# Patient Record
Sex: Male | Born: 1970 | Race: White | Hispanic: No | Marital: Single | Smoking: Never smoker
Health system: Southern US, Community
[De-identification: ages and names within clinical notes are randomized; demographics above are authoritative.]

## PROBLEM LIST (undated history)

## (undated) DIAGNOSIS — Z5189 Encounter for other specified aftercare: Secondary | ICD-10-CM

## (undated) DIAGNOSIS — I10 Essential (primary) hypertension: Secondary | ICD-10-CM

## (undated) DIAGNOSIS — K219 Gastro-esophageal reflux disease without esophagitis: Secondary | ICD-10-CM

## (undated) DIAGNOSIS — E7800 Pure hypercholesterolemia, unspecified: Secondary | ICD-10-CM

## (undated) DIAGNOSIS — F419 Anxiety disorder, unspecified: Secondary | ICD-10-CM

## (undated) HISTORY — DX: Encounter for other specified aftercare: Z51.89

## (undated) HISTORY — DX: Gastro-esophageal reflux disease without esophagitis: K21.9

## (undated) HISTORY — DX: Anxiety disorder, unspecified: F41.9

---

## 2013-06-25 ENCOUNTER — Emergency Department (HOSPITAL_COMMUNITY): Payer: No Typology Code available for payment source

## 2013-06-25 ENCOUNTER — Encounter (HOSPITAL_COMMUNITY): Payer: Self-pay | Admitting: Radiology

## 2013-06-25 ENCOUNTER — Inpatient Hospital Stay (HOSPITAL_COMMUNITY)
Admission: EM | Admit: 2013-06-25 | Discharge: 2013-06-28 | DRG: 472 | Disposition: A | Discharge status: Home Health | Payer: No Typology Code available for payment source | Attending: Surgery | Admitting: Surgery

## 2013-06-25 DIAGNOSIS — D62 Acute posthemorrhagic anemia: Secondary | ICD-10-CM | POA: Diagnosis not present

## 2013-06-25 DIAGNOSIS — F419 Anxiety disorder, unspecified: Secondary | ICD-10-CM | POA: Diagnosis present

## 2013-06-25 DIAGNOSIS — S22019A Unspecified fracture of first thoracic vertebra, initial encounter for closed fracture: Secondary | ICD-10-CM | POA: Diagnosis present

## 2013-06-25 DIAGNOSIS — E7800 Pure hypercholesterolemia, unspecified: Secondary | ICD-10-CM | POA: Diagnosis present

## 2013-06-25 DIAGNOSIS — F10929 Alcohol use, unspecified with intoxication, unspecified: Secondary | ICD-10-CM | POA: Diagnosis present

## 2013-06-25 DIAGNOSIS — S0100XA Unspecified open wound of scalp, initial encounter: Secondary | ICD-10-CM

## 2013-06-25 DIAGNOSIS — I10 Essential (primary) hypertension: Secondary | ICD-10-CM | POA: Diagnosis present

## 2013-06-25 DIAGNOSIS — S129XXA Fracture of neck, unspecified, initial encounter: Secondary | ICD-10-CM

## 2013-06-25 DIAGNOSIS — IMO0002 Reserved for concepts with insufficient information to code with codable children: Secondary | ICD-10-CM | POA: Diagnosis present

## 2013-06-25 DIAGNOSIS — S0101XA Laceration without foreign body of scalp, initial encounter: Secondary | ICD-10-CM | POA: Diagnosis present

## 2013-06-25 DIAGNOSIS — Y92411 Interstate highway as the place of occurrence of the external cause: Secondary | ICD-10-CM

## 2013-06-25 DIAGNOSIS — R339 Retention of urine, unspecified: Secondary | ICD-10-CM | POA: Diagnosis not present

## 2013-06-25 DIAGNOSIS — F411 Generalized anxiety disorder: Secondary | ICD-10-CM | POA: Diagnosis present

## 2013-06-25 DIAGNOSIS — F1010 Alcohol abuse, uncomplicated: Secondary | ICD-10-CM | POA: Diagnosis present

## 2013-06-25 DIAGNOSIS — E785 Hyperlipidemia, unspecified: Secondary | ICD-10-CM | POA: Diagnosis present

## 2013-06-25 DIAGNOSIS — R338 Other retention of urine: Secondary | ICD-10-CM | POA: Diagnosis not present

## 2013-06-25 DIAGNOSIS — S12600A Unspecified displaced fracture of seventh cervical vertebra, initial encounter for closed fracture: Secondary | ICD-10-CM | POA: Diagnosis present

## 2013-06-25 DIAGNOSIS — S22009A Unspecified fracture of unspecified thoracic vertebra, initial encounter for closed fracture: Secondary | ICD-10-CM | POA: Diagnosis present

## 2013-06-25 DIAGNOSIS — S32009A Unspecified fracture of unspecified lumbar vertebra, initial encounter for closed fracture: Secondary | ICD-10-CM

## 2013-06-25 HISTORY — DX: Essential (primary) hypertension: I10

## 2013-06-25 HISTORY — DX: Pure hypercholesterolemia, unspecified: E78.00

## 2013-06-25 LAB — CBC WITH DIFFERENTIAL/PLATELET
BASOS PCT: 1 % (ref 0–1)
Basophils Absolute: 0.1 10*3/uL (ref 0.0–0.1)
EOS ABS: 0 10*3/uL (ref 0.0–0.7)
Eosinophils Relative: 0 % (ref 0–5)
HCT: 41.4 % (ref 39.0–52.0)
HEMOGLOBIN: 14.9 g/dL (ref 13.0–17.0)
Lymphocytes Relative: 50 % — ABNORMAL HIGH (ref 12–46)
Lymphs Abs: 3 10*3/uL (ref 0.7–4.0)
MCH: 34.7 pg — ABNORMAL HIGH (ref 26.0–34.0)
MCHC: 36 g/dL (ref 30.0–36.0)
MCV: 96.3 fL (ref 78.0–100.0)
Monocytes Absolute: 0.5 10*3/uL (ref 0.1–1.0)
Monocytes Relative: 8 % (ref 3–12)
NEUTROS PCT: 41 % — AB (ref 43–77)
Neutro Abs: 2.4 10*3/uL (ref 1.7–7.7)
PLATELETS: 160 10*3/uL (ref 150–400)
RBC: 4.3 MIL/uL (ref 4.22–5.81)
RDW: 14.9 % (ref 11.5–15.5)
WBC: 6 10*3/uL (ref 4.0–10.5)

## 2013-06-25 LAB — ABO/RH: ABO/RH(D): A POS

## 2013-06-25 LAB — I-STAT CHEM 8, ED
BUN: 5 mg/dL — AB (ref 6–23)
CREATININE: 1.3 mg/dL (ref 0.50–1.35)
Calcium, Ion: 0.89 mmol/L — ABNORMAL LOW (ref 1.12–1.23)
Chloride: 101 mEq/L (ref 96–112)
Glucose, Bld: 124 mg/dL — ABNORMAL HIGH (ref 70–99)
HCT: 48 % (ref 39.0–52.0)
Hemoglobin: 16.3 g/dL (ref 13.0–17.0)
POTASSIUM: 6 meq/L — AB (ref 3.7–5.3)
SODIUM: 138 meq/L (ref 137–147)
TCO2: 24 mmol/L (ref 0–100)

## 2013-06-25 LAB — COMPREHENSIVE METABOLIC PANEL
ALBUMIN: 3.7 g/dL (ref 3.5–5.2)
ALT: 78 U/L — AB (ref 0–53)
AST: 194 U/L — AB (ref 0–37)
Alkaline Phosphatase: 105 U/L (ref 39–117)
BILIRUBIN TOTAL: 0.5 mg/dL (ref 0.3–1.2)
BUN: 6 mg/dL (ref 6–23)
CHLORIDE: 94 meq/L — AB (ref 96–112)
CO2: 19 mEq/L (ref 19–32)
Calcium: 8.5 mg/dL (ref 8.4–10.5)
Creatinine, Ser: 0.7 mg/dL (ref 0.50–1.35)
GFR calc Af Amer: >90 mL/min (ref >90)
GFR calc non Af Amer: >90 mL/min (ref >90)
Glucose, Bld: 117 mg/dL — ABNORMAL HIGH (ref 70–99)
POTASSIUM: 5 meq/L (ref 3.7–5.3)
SODIUM: 137 meq/L (ref 137–147)
TOTAL PROTEIN: 8.6 g/dL — AB (ref 6.0–8.3)

## 2013-06-25 LAB — ETHANOL: Alcohol, Ethyl (B): 405 mg/dL (ref 0–11)

## 2013-06-25 LAB — PROTIME-INR
INR: 0.98 (ref 0.00–1.49)
PROTHROMBIN TIME: 12.8 s (ref 11.6–15.2)

## 2013-06-25 LAB — TYPE AND SCREEN
ABO/RH(D): A POS
Antibody Screen: NEGATIVE

## 2013-06-25 LAB — I-STAT CG4 LACTIC ACID, ED: LACTIC ACID, VENOUS: 5.34 mmol/L — AB (ref 0.5–2.2)

## 2013-06-25 LAB — CDS SEROLOGY

## 2013-06-25 MED ORDER — ONDANSETRON HCL 4 MG/2ML IJ SOLN
INTRAMUSCULAR | Status: AC
  Administered 2013-06-26: 4 mg
  Filled 2013-06-25: qty 2

## 2013-06-25 MED ORDER — SODIUM CHLORIDE 0.9 % IV SOLN
INTRAVENOUS | Status: AC | PRN
  Administered 2013-06-25: 125 mL/h via INTRAVENOUS

## 2013-06-25 MED ORDER — IOHEXOL 300 MG/ML  SOLN
100.0000 mL | Freq: Once | INTRAMUSCULAR | Status: AC | PRN
  Administered 2013-06-25: 100 mL via INTRAVENOUS
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
## 2013-06-25 NOTE — ED Notes (Addendum)
All belongings (jewelry and clothing, shoes) given to financee

## 2013-06-25 NOTE — ED Notes (Signed)
Patient transported to MRI 

## 2013-06-25 NOTE — ED Notes (Addendum)
Pt transported to CT ?

## 2013-06-25 NOTE — ED Notes (Signed)
NOTIFIED DR. BEATON IN PERSON OF PATIENTS LAB RESULTS OF CHEM 8 + ,CG4+ LACTIC ACID, 19:10 ,06/25/2013

## 2013-06-25 NOTE — ED Notes (Addendum)
Head laceration sutured with 29 staples, bleeding controlled and  Dressing applied.  Face and hands cleaned

## 2013-06-25 NOTE — ED Notes (Signed)
Dr. Dutch QuintPoole in to see patient and explained to him regarding the need for surgery sometime tomorrow.  Old collar removed and Aspen collar applied by Dr. Dutch QuintPoole.

## 2013-06-25 NOTE — ED Notes (Signed)
Patient back from MRI.
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
## 2013-06-26 ENCOUNTER — Encounter (HOSPITAL_COMMUNITY): Admission: EM | Disposition: A | Discharge status: Home Health | Payer: Self-pay | Source: Home / Self Care

## 2013-06-26 ENCOUNTER — Encounter (HOSPITAL_COMMUNITY): Payer: No Typology Code available for payment source | Admitting: Anesthesiology

## 2013-06-26 ENCOUNTER — Inpatient Hospital Stay (HOSPITAL_COMMUNITY): Payer: No Typology Code available for payment source

## 2013-06-26 ENCOUNTER — Inpatient Hospital Stay (HOSPITAL_COMMUNITY): Payer: No Typology Code available for payment source | Admitting: Anesthesiology

## 2013-06-26 DIAGNOSIS — I10 Essential (primary) hypertension: Secondary | ICD-10-CM | POA: Insufficient documentation

## 2013-06-26 DIAGNOSIS — D62 Acute posthemorrhagic anemia: Secondary | ICD-10-CM

## 2013-06-26 DIAGNOSIS — E7800 Pure hypercholesterolemia, unspecified: Secondary | ICD-10-CM | POA: Insufficient documentation

## 2013-06-26 DIAGNOSIS — F1010 Alcohol abuse, uncomplicated: Secondary | ICD-10-CM | POA: Diagnosis present

## 2013-06-26 DIAGNOSIS — S0101XA Laceration without foreign body of scalp, initial encounter: Secondary | ICD-10-CM | POA: Diagnosis present

## 2013-06-26 DIAGNOSIS — F10929 Alcohol use, unspecified with intoxication, unspecified: Secondary | ICD-10-CM | POA: Diagnosis present

## 2013-06-26 DIAGNOSIS — S22019A Unspecified fracture of first thoracic vertebra, initial encounter for closed fracture: Secondary | ICD-10-CM | POA: Diagnosis present

## 2013-06-26 HISTORY — PX: ANTERIOR CERVICAL DECOMP/DISCECTOMY FUSION: SHX1161

## 2013-06-26 LAB — MRSA PCR SCREENING: MRSA by PCR: NEGATIVE

## 2013-06-26 LAB — BASIC METABOLIC PANEL
BUN: 6 mg/dL (ref 6–23)
CALCIUM: 7.9 mg/dL — AB (ref 8.4–10.5)
CO2: 19 mEq/L (ref 19–32)
Chloride: 99 mEq/L (ref 96–112)
Creatinine, Ser: 0.67 mg/dL (ref 0.50–1.35)
GFR calc non Af Amer: >90 mL/min (ref >90)
Glucose, Bld: 115 mg/dL — ABNORMAL HIGH (ref 70–99)
Potassium: 4.5 mEq/L (ref 3.7–5.3)
Sodium: 140 mEq/L (ref 137–147)

## 2013-06-26 LAB — CBC
HEMATOCRIT: 34.9 % — AB (ref 39.0–52.0)
Hemoglobin: 12.1 g/dL — ABNORMAL LOW (ref 13.0–17.0)
MCH: 33.7 pg (ref 26.0–34.0)
MCHC: 34.7 g/dL (ref 30.0–36.0)
MCV: 97.2 fL (ref 78.0–100.0)
Platelets: 132 10*3/uL — ABNORMAL LOW (ref 150–400)
RBC: 3.59 MIL/uL — ABNORMAL LOW (ref 4.22–5.81)
RDW: 14.6 % (ref 11.5–15.5)
WBC: 6.3 10*3/uL (ref 4.0–10.5)

## 2013-06-26 SURGERY — ANTERIOR CERVICAL DECOMPRESSION/DISCECTOMY FUSION 1 LEVEL/HARDWARE REMOVAL
Anesthesia: General | Site: Neck

## 2013-06-26 MED ORDER — THIAMINE HCL 100 MG/ML IJ SOLN
100.0000 mg | Freq: Every day | INTRAMUSCULAR | Status: DC
  Filled 2013-06-26 (×3): qty 1

## 2013-06-26 MED ORDER — ONDANSETRON HCL 4 MG PO TABS: 4.0000 mg | ORAL_TABLET | Freq: Four times a day (QID) | ORAL | Status: DC | PRN

## 2013-06-26 MED ORDER — THROMBIN 5000 UNITS EX SOLR
CUTANEOUS | Status: DC | PRN
Start: 2013-06-26 — End: 2013-06-26
  Administered 2013-06-26 (×2): 5000 [IU] via TOPICAL

## 2013-06-26 MED ORDER — MORPHINE SULFATE 2 MG/ML IJ SOLN
2.0000 mg | INTRAMUSCULAR | Status: DC | PRN
  Administered 2013-06-26: 4 mg via INTRAVENOUS
  Administered 2013-06-26: 2 mg via INTRAVENOUS
  Administered 2013-06-26: 4 mg via INTRAVENOUS
  Filled 2013-06-26: qty 1
  Filled 2013-06-26 (×2): qty 2

## 2013-06-26 MED ORDER — LORAZEPAM 2 MG/ML IJ SOLN
1.0000 mg | Freq: Four times a day (QID) | INTRAMUSCULAR | Status: DC | PRN
Start: 2013-06-26 — End: 2013-06-26

## 2013-06-26 MED ORDER — LORAZEPAM 2 MG/ML IJ SOLN: 0.0000 mg | Freq: Two times a day (BID) | INTRAMUSCULAR | Status: DC

## 2013-06-26 MED ORDER — MIDAZOLAM HCL 5 MG/5ML IJ SOLN
INTRAMUSCULAR | Status: DC | PRN
  Administered 2013-06-26: 2 mg via INTRAVENOUS

## 2013-06-26 MED ORDER — ONDANSETRON HCL 4 MG/2ML IJ SOLN: 4.0000 mg | INTRAMUSCULAR | Status: DC | PRN

## 2013-06-26 MED ORDER — FENTANYL CITRATE 0.05 MG/ML IJ SOLN
INTRAMUSCULAR | Status: AC
  Filled 2013-06-26: qty 5

## 2013-06-26 MED ORDER — NEOSTIGMINE METHYLSULFATE 1 MG/ML IJ SOLN
INTRAMUSCULAR | Status: DC | PRN
  Administered 2013-06-26: 5 mg via INTRAVENOUS

## 2013-06-26 MED ORDER — SODIUM CHLORIDE 0.9 % IJ SOLN
3.0000 mL | Freq: Two times a day (BID) | INTRAMUSCULAR | Status: DC
  Administered 2013-06-26 – 2013-06-28 (×4): 3 mL via INTRAVENOUS

## 2013-06-26 MED ORDER — LIDOCAINE HCL (CARDIAC) 20 MG/ML IV SOLN
INTRAVENOUS | Status: AC
  Filled 2013-06-26: qty 5

## 2013-06-26 MED ORDER — PHENYLEPHRINE HCL 10 MG/ML IJ SOLN
INTRAMUSCULAR | Status: DC | PRN
  Administered 2013-06-26: 160 ug via INTRAVENOUS

## 2013-06-26 MED ORDER — FOLIC ACID 1 MG PO TABS
1.0000 mg | ORAL_TABLET | Freq: Every day | ORAL | Status: DC
  Administered 2013-06-27 – 2013-06-28 (×2): 1 mg via ORAL
  Filled 2013-06-26 (×3): qty 1

## 2013-06-26 MED ORDER — VITAMIN B-1 100 MG PO TABS
100.0000 mg | ORAL_TABLET | Freq: Every day | ORAL | Status: DC
  Administered 2013-06-27 – 2013-06-28 (×2): 100 mg via ORAL
  Filled 2013-06-26 (×3): qty 1

## 2013-06-26 MED ORDER — PANTOPRAZOLE SODIUM 40 MG IV SOLR
40.0000 mg | Freq: Every day | INTRAVENOUS | Status: DC
  Filled 2013-06-26 (×2): qty 40

## 2013-06-26 MED ORDER — OXYCODONE-ACETAMINOPHEN 5-325 MG PO TABS: 1.0000 | ORAL_TABLET | ORAL | Status: DC | PRN

## 2013-06-26 MED ORDER — CEFAZOLIN SODIUM-DEXTROSE 2-3 GM-% IV SOLR
2.0000 g | INTRAVENOUS | Status: AC
  Administered 2013-06-26: 2 g via INTRAVENOUS
  Filled 2013-06-26: qty 50

## 2013-06-26 MED ORDER — ONDANSETRON HCL 4 MG/2ML IJ SOLN
INTRAMUSCULAR | Status: DC | PRN
  Administered 2013-06-26: 4 mg via INTRAVENOUS

## 2013-06-26 MED ORDER — MENTHOL 3 MG MT LOZG
1.0000 | LOZENGE | OROMUCOSAL | Status: DC | PRN
  Filled 2013-06-26: qty 9

## 2013-06-26 MED ORDER — GLYCOPYRROLATE 0.2 MG/ML IJ SOLN
INTRAMUSCULAR | Status: AC
  Filled 2013-06-26: qty 3

## 2013-06-26 MED ORDER — SODIUM CHLORIDE 0.9 % IR SOLN
Status: DC | PRN
  Administered 2013-06-26: 19:00:00

## 2013-06-26 MED ORDER — FENTANYL CITRATE 0.05 MG/ML IJ SOLN
INTRAMUSCULAR | Status: DC | PRN
Start: 2013-06-26 — End: 2013-06-26
  Administered 2013-06-26: 100 ug via INTRAVENOUS
  Administered 2013-06-26: 150 ug via INTRAVENOUS
  Administered 2013-06-26 (×2): 100 ug via INTRAVENOUS
  Administered 2013-06-26: 50 ug via INTRAVENOUS

## 2013-06-26 MED ORDER — FLUMAZENIL 0.5 MG/5ML IV SOLN
0.5000 mg | Freq: Once | INTRAVENOUS | Status: AC
  Administered 2013-06-26: 0.5 mg via INTRAVENOUS

## 2013-06-26 MED ORDER — NEOSTIGMINE METHYLSULFATE 1 MG/ML IJ SOLN
INTRAMUSCULAR | Status: AC
Start: 2013-06-26 — End: 2013-06-26
  Filled 2013-06-26: qty 10

## 2013-06-26 MED ORDER — SENNA 8.6 MG PO TABS
1.0000 | ORAL_TABLET | Freq: Two times a day (BID) | ORAL | Status: DC
  Administered 2013-06-26 – 2013-06-28 (×4): 8.6 mg via ORAL
  Filled 2013-06-26 (×6): qty 1

## 2013-06-26 MED ORDER — ROCURONIUM BROMIDE 50 MG/5ML IV SOLN
INTRAVENOUS | Status: AC
  Filled 2013-06-26: qty 1

## 2013-06-26 MED ORDER — PROPOFOL 10 MG/ML IV BOLUS
INTRAVENOUS | Status: AC
Start: 2013-06-26 — End: 2013-06-26
  Filled 2013-06-26: qty 20

## 2013-06-26 MED ORDER — ADULT MULTIVITAMIN W/MINERALS CH
1.0000 | ORAL_TABLET | Freq: Every day | ORAL | Status: DC
  Administered 2013-06-27 – 2013-06-28 (×2): 1 via ORAL
  Filled 2013-06-26 (×3): qty 1

## 2013-06-26 MED ORDER — LIDOCAINE HCL (CARDIAC) 20 MG/ML IV SOLN
INTRAVENOUS | Status: DC | PRN
  Administered 2013-06-26: 60 mg via INTRAVENOUS

## 2013-06-26 MED ORDER — SODIUM CHLORIDE 0.9 % IV SOLN
250.0000 mL | INTRAVENOUS | Status: DC
  Administered 2013-06-26: 23:00:00 via INTRAVENOUS

## 2013-06-26 MED ORDER — SODIUM CHLORIDE 0.9 % IJ SOLN: 3.0000 mL | INTRAMUSCULAR | Status: DC | PRN

## 2013-06-26 MED ORDER — HEMOSTATIC AGENTS (NO CHARGE) OPTIME
TOPICAL | Status: DC | PRN
  Administered 2013-06-26: 1 via TOPICAL

## 2013-06-26 MED ORDER — FLUMAZENIL 0.5 MG/5ML IV SOLN
INTRAVENOUS | Status: AC
  Administered 2013-06-26: 0.5 mg via INTRAVENOUS
  Filled 2013-06-26: qty 5

## 2013-06-26 MED ORDER — ACETAMINOPHEN 650 MG RE SUPP: 650.0000 mg | RECTAL | Status: DC | PRN

## 2013-06-26 MED ORDER — CYCLOBENZAPRINE HCL 10 MG PO TABS: 10.0000 mg | ORAL_TABLET | Freq: Three times a day (TID) | ORAL | Status: DC | PRN

## 2013-06-26 MED ORDER — LORAZEPAM 2 MG/ML IJ SOLN
2.0000 mg | INTRAMUSCULAR | Status: DC | PRN
  Administered 2013-06-26: 2 mg via INTRAVENOUS
  Filled 2013-06-26: qty 1

## 2013-06-26 MED ORDER — HYDROCODONE-ACETAMINOPHEN 5-325 MG PO TABS: 1.0000 | ORAL_TABLET | ORAL | Status: DC | PRN

## 2013-06-26 MED ORDER — ONDANSETRON HCL 4 MG/2ML IJ SOLN
INTRAMUSCULAR | Status: AC
  Filled 2013-06-26: qty 2

## 2013-06-26 MED ORDER — ACETAMINOPHEN 325 MG PO TABS
650.0000 mg | ORAL_TABLET | ORAL | Status: DC | PRN
  Administered 2013-06-27: 650 mg via ORAL
  Filled 2013-06-26: qty 2

## 2013-06-26 MED ORDER — PHENOL 1.4 % MT LIQD: 1.0000 | OROMUCOSAL | Status: DC | PRN

## 2013-06-26 MED ORDER — GLYCOPYRROLATE 0.2 MG/ML IJ SOLN
INTRAMUSCULAR | Status: DC | PRN
  Administered 2013-06-26: 0.6 mg via INTRAVENOUS

## 2013-06-26 MED ORDER — LACTATED RINGERS IV SOLN
INTRAVENOUS | Status: DC | PRN
  Administered 2013-06-26 (×2): via INTRAVENOUS

## 2013-06-26 MED ORDER — ROCURONIUM BROMIDE 100 MG/10ML IV SOLN
INTRAVENOUS | Status: DC | PRN
  Administered 2013-06-26: 50 mg via INTRAVENOUS
  Administered 2013-06-26: 10 mg via INTRAVENOUS

## 2013-06-26 MED ORDER — ALUM & MAG HYDROXIDE-SIMETH 200-200-20 MG/5ML PO SUSP: 30.0000 mL | Freq: Four times a day (QID) | ORAL | Status: DC | PRN

## 2013-06-26 MED ORDER — PANTOPRAZOLE SODIUM 40 MG PO TBEC: 40.0000 mg | DELAYED_RELEASE_TABLET | Freq: Every day | ORAL | Status: DC

## 2013-06-26 MED ORDER — HYDROMORPHONE HCL PF 1 MG/ML IJ SOLN
0.5000 mg | INTRAMUSCULAR | Status: DC | PRN
Start: 2013-06-26 — End: 2013-06-28
  Administered 2013-06-27 (×3): 1 mg via INTRAVENOUS
  Filled 2013-06-26 (×3): qty 1

## 2013-06-26 MED ORDER — 0.9 % SODIUM CHLORIDE (POUR BTL) OPTIME
TOPICAL | Status: DC | PRN
  Administered 2013-06-26: 1000 mL

## 2013-06-26 MED ORDER — LORAZEPAM 2 MG/ML IJ SOLN
0.0000 mg | Freq: Four times a day (QID) | INTRAMUSCULAR | Status: DC
  Administered 2013-06-26: 4 mg via INTRAVENOUS
  Administered 2013-06-26: 2 mg via INTRAVENOUS
  Filled 2013-06-26: qty 1
  Filled 2013-06-26: qty 2

## 2013-06-26 MED ORDER — ONDANSETRON HCL 4 MG/2ML IJ SOLN: 4.0000 mg | Freq: Four times a day (QID) | INTRAMUSCULAR | Status: DC | PRN

## 2013-06-26 MED ORDER — LORAZEPAM 1 MG PO TABS: 1.0000 mg | ORAL_TABLET | Freq: Four times a day (QID) | ORAL | Status: DC | PRN

## 2013-06-26 MED ORDER — MIDAZOLAM HCL 2 MG/2ML IJ SOLN
INTRAMUSCULAR | Status: AC
  Filled 2013-06-26: qty 2

## 2013-06-26 MED ORDER — POTASSIUM CHLORIDE IN NACL 20-0.9 MEQ/L-% IV SOLN
INTRAVENOUS | Status: DC
  Administered 2013-06-26: 02:00:00 via INTRAVENOUS
  Filled 2013-06-26 (×4): qty 1000

## 2013-06-26 MED ORDER — CEFAZOLIN SODIUM 1-5 GM-% IV SOLN
1.0000 g | Freq: Three times a day (TID) | INTRAVENOUS | Status: AC
  Administered 2013-06-26 – 2013-06-27 (×2): 1 g via INTRAVENOUS
  Filled 2013-06-26 (×2): qty 50

## 2013-06-26 MED ORDER — PROPOFOL 10 MG/ML IV BOLUS
INTRAVENOUS | Status: DC | PRN
  Administered 2013-06-26: 300 mg via INTRAVENOUS

## 2013-06-26 SURGICAL SUPPLY — 68 items
BAG DECANTER FOR FLEXI CONT (MISCELLANEOUS) ×3 IMPLANT
BENZOIN TINCTURE PRP APPL 2/3 (GAUZE/BANDAGES/DRESSINGS) ×3 IMPLANT
BIT DRILL NEURO 2X3.1 SFT TUCH (MISCELLANEOUS) IMPLANT
BRUSH SCRUB EZ PLAIN DRY (MISCELLANEOUS) ×3 IMPLANT
BUR MATCHSTICK NEURO 3.0 LAGG (BURR) ×3 IMPLANT
CANISTER SUCT 3000ML (MISCELLANEOUS) ×3 IMPLANT
CLOSURE WOUND 1/2 X4 (GAUZE/BANDAGES/DRESSINGS) ×1
CONT SPEC 4OZ CLIKSEAL STRL BL (MISCELLANEOUS) ×3 IMPLANT
DRAIN SNY WOU 7FLT (WOUND CARE) ×3 IMPLANT
DRAPE C-ARM 42X72 X-RAY (DRAPES) ×6 IMPLANT
DRAPE LAPAROTOMY 100X72 PEDS (DRAPES) ×3 IMPLANT
DRAPE MICROSCOPE LEICA (MISCELLANEOUS) ×3 IMPLANT
DRAPE MICROSCOPE ZEISS OPMI (DRAPES) IMPLANT
DRAPE POUCH INSTRU U-SHP 10X18 (DRAPES) ×3 IMPLANT
DRILL BIT (BIT) ×3 IMPLANT
DRILL NEURO 2X3.1 SOFT TOUCH (MISCELLANEOUS)
DRSG OPSITE POSTOP 4X8 (GAUZE/BANDAGES/DRESSINGS) ×3 IMPLANT
DURAPREP 6ML APPLICATOR 50/CS (WOUND CARE) ×3 IMPLANT
ELECT COATED BLADE 2.86 ST (ELECTRODE) ×3 IMPLANT
ELECT REM PT RETURN 9FT ADLT (ELECTROSURGICAL) ×3
ELECTRODE REM PT RTRN 9FT ADLT (ELECTROSURGICAL) ×1 IMPLANT
GAUZE SPONGE 4X4 16PLY XRAY LF (GAUZE/BANDAGES/DRESSINGS) IMPLANT
GLOVE BIO SURGEON STRL SZ 6.5 (GLOVE) ×4 IMPLANT
GLOVE BIO SURGEONS STRL SZ 6.5 (GLOVE) ×2
GLOVE BIOGEL PI IND STRL 6.5 (GLOVE) ×1 IMPLANT
GLOVE BIOGEL PI IND STRL 7.5 (GLOVE) ×1 IMPLANT
GLOVE BIOGEL PI INDICATOR 6.5 (GLOVE) ×2
GLOVE BIOGEL PI INDICATOR 7.5 (GLOVE) ×2
GLOVE ECLIPSE 6.5 STRL STRAW (GLOVE) ×3 IMPLANT
GLOVE ECLIPSE 7.5 STRL STRAW (GLOVE) ×3 IMPLANT
GLOVE ECLIPSE 8.5 STRL (GLOVE) ×3 IMPLANT
GLOVE EXAM NITRILE LRG STRL (GLOVE) ×3 IMPLANT
GLOVE EXAM NITRILE MD LF STRL (GLOVE) IMPLANT
GLOVE EXAM NITRILE XL STR (GLOVE) IMPLANT
GLOVE EXAM NITRILE XS STR PU (GLOVE) IMPLANT
GOWN BRE IMP SLV AUR LG STRL (GOWN DISPOSABLE) IMPLANT
GOWN BRE IMP SLV AUR XL STRL (GOWN DISPOSABLE) IMPLANT
GOWN STRL REIN 2XL LVL4 (GOWN DISPOSABLE) IMPLANT
GOWN STRL REUS W/ TWL LRG LVL3 (GOWN DISPOSABLE) ×2 IMPLANT
GOWN STRL REUS W/ TWL XL LVL3 (GOWN DISPOSABLE) ×2 IMPLANT
GOWN STRL REUS W/TWL LRG LVL3 (GOWN DISPOSABLE) ×4
GOWN STRL REUS W/TWL XL LVL3 (GOWN DISPOSABLE) ×4
HALTER HD/CHIN CERV TRACTION D (MISCELLANEOUS) ×3 IMPLANT
KIT BASIN OR (CUSTOM PROCEDURE TRAY) ×3 IMPLANT
KIT ROOM TURNOVER OR (KITS) ×3 IMPLANT
NEEDLE SPNL 20GX3.5 QUINCKE YW (NEEDLE) ×3 IMPLANT
NS IRRIG 1000ML POUR BTL (IV SOLUTION) ×3 IMPLANT
PACK LAMINECTOMY NEURO (CUSTOM PROCEDURE TRAY) ×3 IMPLANT
PAD ARMBOARD 7.5X6 YLW CONV (MISCELLANEOUS) ×9 IMPLANT
PLATE VISION ELITE 27.5MM (Plate) ×3 IMPLANT
RUBBERBAND STERILE (MISCELLANEOUS) ×6 IMPLANT
SCREW ST 15X4XST FXANG NS (Screw) ×4 IMPLANT
SCREW ST FIX 4 ATL (Screw) ×8 IMPLANT
SPACER BONE CORNERSTONE 8X14 (Orthopedic Implant) ×3 IMPLANT
SPONGE GAUZE 4X4 12PLY (GAUZE/BANDAGES/DRESSINGS) ×3 IMPLANT
SPONGE INTESTINAL PEANUT (DISPOSABLE) ×3 IMPLANT
SPONGE SURGIFOAM ABS GEL SZ50 (HEMOSTASIS) ×3 IMPLANT
STRIP CLOSURE SKIN 1/2X4 (GAUZE/BANDAGES/DRESSINGS) ×2 IMPLANT
SUT PDS AB 5-0 P3 18 (SUTURE) ×3 IMPLANT
SUT VIC AB 3-0 SH 8-18 (SUTURE) ×3 IMPLANT
SYR 20ML ECCENTRIC (SYRINGE) ×3 IMPLANT
SYR BULB IRRIGATION 50ML (SYRINGE) ×3 IMPLANT
TAPE CLOTH 4X10 WHT NS (GAUZE/BANDAGES/DRESSINGS) ×3 IMPLANT
TAPE STRIPS DRAPE STRL (GAUZE/BANDAGES/DRESSINGS) ×3 IMPLANT
TOWEL OR 17X24 6PK STRL BLUE (TOWEL DISPOSABLE) ×3 IMPLANT
TOWEL OR 17X26 10 PK STRL BLUE (TOWEL DISPOSABLE) ×3 IMPLANT
TRAP SPECIMEN MUCOUS 40CC (MISCELLANEOUS) ×3 IMPLANT
WATER STERILE IRR 1000ML POUR (IV SOLUTION) ×3 IMPLANT

## 2013-06-26 NOTE — Anesthesia Preprocedure Evaluation (Signed)
Anesthesia Evaluation  Patient identified by MRN, date of birth, ID band Patient awake    Reviewed: Allergy & Precautions, H&P , NPO status , Patient's Chart, lab work & pertinent test results  History of Anesthesia Complications Negative for: history of anesthetic complications  Airway Mallampati: III TM Distance: >3 FB Neck ROM: Limited    Dental  (+) Dental Advisory Given   Pulmonary neg pulmonary ROS,  breath sounds clear to auscultation  Pulmonary exam normal       Cardiovascular hypertension (no longer can afford HTN meds), - anginaRhythm:Regular Rate:Normal     Neuro/Psych  C-spine not cleared    GI/Hepatic negative GI ROS, (+)     substance abuse  alcohol use, Elevated LFTs   Endo/Other  negative endocrine ROS  Renal/GU negative Renal ROS     Musculoskeletal   Abdominal   Peds  Hematology   Anesthesia Other Findings   Reproductive/Obstetrics                           Anesthesia Physical Anesthesia Plan  ASA: III  Anesthesia Plan: General   Post-op Pain Management:    Induction: Intravenous  Airway Management Planned: Oral ETT and Video Laryngoscope Planned  Additional Equipment:   Intra-op Plan:   Post-operative Plan: Extubation in OR  Informed Consent: I have reviewed the patients History and Physical, chart, labs and discussed the procedure including the risks, benefits and alternatives for the proposed anesthesia with the patient or authorized representative who has indicated his/her understanding and acceptance.   Dental advisory given  Plan Discussed with: Surgeon and CRNA  Anesthesia Plan Comments: (Plan routine monitors, GETA with c-collar on and VideoGlide intubation )        Anesthesia Quick Evaluation
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
## 2013-06-26 NOTE — Progress Notes (Signed)
Patient to have surgery today.  CIWA protocol.  This patient has been seen and I agree with the findings and treatment plan.  Marta LamasJames O. Gae BonWyatt, III, MD, FACS [phone] (pager) [phone] (direct pager) Trauma Surgeon

## 2013-06-26 NOTE — Anesthesia Postprocedure Evaluation (Signed)
  Anesthesia Post-op Note  Patient: Tim CockayneChris Balaban  Procedure(s) Performed: Procedure(s) with comments: ANTERIOR CERVICAL DECOMPRESSION/DISCECTOMY FUSION 1 LEVEL/HARDWARE REMOVAL (N/A) - Cervical seven - Thoracic one Anterior Cervical Decompression Fusion  Patient Location: PACU  Anesthesia Type:General  Level of Consciousness: awake, oriented, sedated and patient cooperative  Airway and Oxygen Therapy: Patient Spontanous Breathing  Post-op Pain: mild  Post-op Assessment: Post-op Vital signs reviewed, Patient's Cardiovascular Status Stable, Respiratory Function Stable, Patent Airway, No signs of Nausea or vomiting and Pain level controlled  Post-op Vital Signs: stable  Complications: No apparent anesthesia complications

## 2013-06-26 NOTE — Transfer of Care (Signed)
Immediate Anesthesia Transfer of Care Note  Patient: Tim CockayneChris Tapscott  Procedure(s) Performed: Procedure(s) with comments: ANTERIOR CERVICAL DECOMPRESSION/DISCECTOMY FUSION 1 LEVEL/HARDWARE REMOVAL (N/A) - Cervical seven - Thoracic one Anterior Cervical Decompression Fusion  Patient Location: PACU  Anesthesia Type:General  Level of Consciousness: sedated  Airway & Oxygen Therapy: Patient Spontanous Breathing and Patient connected to face mask oxygen  Post-op Assessment: Report given to PACU RN and Post -op Vital signs reviewed and stable  Post vital signs: Reviewed and stable  Complications: No apparent anesthesia complications
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
## 2013-06-26 NOTE — Anesthesia Procedure Notes (Signed)
Procedure Name: Intubation Date/Time: 06/26/2013 6:26 PM Performed by: Coralee RudFLORES, Myrtha Tonkovich Pre-anesthesia Checklist: Patient identified, Emergency Drugs available, Suction available and Patient being monitored Patient Re-evaluated:Patient Re-evaluated prior to inductionOxygen Delivery Method: Circle system utilized Preoxygenation: Pre-oxygenation with 100% oxygen Intubation Type: IV induction Ventilation: Mask ventilation without difficulty Laryngoscope Size: Miller and 3 Grade View: Grade II Tube type: Oral Tube size: 7.5 mm Number of attempts: 2 (Patient in Aspen collar with stabilization by Dr. Burman RiisSmitm) Airway Equipment and Method: Stylet Placement Confirmation: ETT inserted through vocal cords under direct vision,  positive ETCO2 and breath sounds checked- equal and bilateral Secured at: 1 cm Tube secured with: Tape Dental Injury: Teeth and Oropharynx as per pre-operative assessment

## 2013-06-26 NOTE — Progress Notes (Signed)
Patient stable overnight. Still with neck pain still with headache. No radiating upper extremity pain. Denies weakness or sensory loss. Denies lower extremity symptoms.  Afebrile vital stable. Motor examination intact except for some slight intrinsic weakness in his right hand. Sensory examination nonfocal. Reflexes normal.  MRI scan done last night demonstrates severe dorsal ligamentous injury over several levels but particularly at C7 with a avulsion of the spinous process of C7 and central lamina of C7. Bilateral perched facets which showed fractures of the superior facets of T1 bilaterally. Traumatic disc herniation more toward the right side at C7-T1. Neural foraminal stenosis on the left side at C7-T1. No evidence of other disc herniation. No evidence of spinal cord signal abnormality. Small amount of dorsal epidural blood inches noncompressive.  Fracture subluxation C7 on T1 with some mild evidence of radiculopathy but no evidence of spinal cord injury. I discussed situation with the patient. I am hopeful that we can achieve decompression and stabilization by means of a C7/T1 anterior cervical discectomy and interbody fusion with interbody peek cage, local autograft, and anterior plate instrumentation. I've discussed the risks and benefits involved with surgery including but not limited to the risk of anesthesia, bleeding, infection, CSF leak, nerve root injury, spinal cord injury, fusion failure, instrumentation failure, continued pain, and nondistended. Patient is also aware that he may potentially need posterior cervical fusion as well. The patient agrees to proceed.

## 2013-06-26 NOTE — Brief Op Note (Signed)
06/25/2013 - 06/26/2013  8:03 PM  PATIENT:  Tim Hunter  43 y.o. male  PRE-OPERATIVE DIAGNOSIS:  C7/T1 fracture dislocation  POST-OPERATIVE DIAGNOSIS:  C7/T1 fracture dislocation  PROCEDURE:  Procedure(s) with comments: ANTERIOR CERVICAL DECOMPRESSION/DISCECTOMY FUSION 1 LEVEL/HARDWARE REMOVAL (N/A) - Cervical seven - Thoracic one Anterior Cervical Decompression Fusion  SURGEON:  Surgeon(s) and Role:    * Tim PaciniHenry A Jozee Hammer, MD - Primary    * Tim HurtKyle L Cabbell, MD - Assisting  PHYSICIAN ASSISTANT:   ASSISTANTS:    ANESTHESIA:   general  EBL:  Total I/O In: 1500 [I.V.:1500] Out: 200 [Blood:200]  BLOOD ADMINISTERED:none  DRAINS: (7 mm) Jackson-Pratt drain(s) with closed bulb suction in the Prevertebral space   LOCAL MEDICATIONS USED:  NONE  SPECIMEN:  No Specimen  DISPOSITION OF SPECIMEN:  N/A  COUNTS:  YES  TOURNIQUET:  * No tourniquets in log *  DICTATION: .Dragon Dictation  PLAN OF CARE: Admit to inpatient   PATIENT DISPOSITION:  PACU - hemodynamically stable.   Delay start of Pharmacological VTE agent (>24hrs) due to surgical blood loss or risk of bleeding: yes

## 2013-06-27 ENCOUNTER — Encounter (HOSPITAL_COMMUNITY): Payer: Self-pay | Admitting: Neurosurgery

## 2013-06-27 DIAGNOSIS — R338 Other retention of urine: Secondary | ICD-10-CM | POA: Diagnosis not present

## 2013-06-27 DIAGNOSIS — F419 Anxiety disorder, unspecified: Secondary | ICD-10-CM | POA: Diagnosis present

## 2013-06-27 LAB — URINALYSIS, ROUTINE W REFLEX MICROSCOPIC
Glucose, UA: NEGATIVE mg/dL
HGB URINE DIPSTICK: NEGATIVE
KETONES UR: 15 mg/dL — AB
Leukocytes, UA: NEGATIVE
NITRITE: NEGATIVE
PROTEIN: NEGATIVE mg/dL
Specific Gravity, Urine: 1.021 (ref 1.005–1.030)
UROBILINOGEN UA: 2 mg/dL — AB (ref 0.0–1.0)
pH: 7 (ref 5.0–8.0)

## 2013-06-27 MED ORDER — SPIRITUS FRUMENTI
2.0000 | Freq: Three times a day (TID) | ORAL | Status: DC
  Administered 2013-06-27: 2 via ORAL
  Administered 2013-06-27: 1 via ORAL
  Administered 2013-06-28 (×2): 2 via ORAL
  Filled 2013-06-27 (×6): qty 2

## 2013-06-27 MED ORDER — LORAZEPAM 2 MG/ML IJ SOLN: 1.0000 mg | Freq: Four times a day (QID) | INTRAMUSCULAR | Status: DC | PRN

## 2013-06-27 MED ORDER — MORPHINE SULFATE 2 MG/ML IJ SOLN: 2.0000 mg | INTRAMUSCULAR | Status: DC | PRN

## 2013-06-27 MED ORDER — OXYCODONE HCL 5 MG PO TABS: 5.0000 mg | ORAL_TABLET | ORAL | Status: DC | PRN

## 2013-06-27 MED ORDER — NAPROXEN 500 MG PO TABS
500.0000 mg | ORAL_TABLET | Freq: Two times a day (BID) | ORAL | Status: DC
  Administered 2013-06-27 – 2013-06-28 (×3): 500 mg via ORAL
  Filled 2013-06-27 (×6): qty 1

## 2013-06-27 MED ORDER — BACITRACIN ZINC 500 UNIT/GM EX OINT
TOPICAL_OINTMENT | Freq: Two times a day (BID) | CUTANEOUS | Status: DC
  Administered 2013-06-27 – 2013-06-28 (×2): via TOPICAL
  Filled 2013-06-27: qty 28.35

## 2013-06-27 MED ORDER — ENOXAPARIN SODIUM 40 MG/0.4ML ~~LOC~~ SOLN
40.0000 mg | SUBCUTANEOUS | Status: DC
  Filled 2013-06-27 (×2): qty 0.4

## 2013-06-27 MED ORDER — LORAZEPAM 1 MG PO TABS: 1.0000 mg | ORAL_TABLET | Freq: Four times a day (QID) | ORAL | Status: DC | PRN

## 2013-06-27 NOTE — Progress Notes (Signed)
Postop day 1. No complaints of pain. Still says his right arm feels somewhat sore. No numbness or weakness.  Afebrile. Vital stable. Awake and alert. Oriented and appropriate. Motor and sensory function intact to direct testing. Wound with bloody drainage on the dressing however the wound itself is flat without evidence of superficial or deep hematoma. Airway midline. Breathing easily.  Status post C7-T1 anterior cervical discectomy and fusion. Mobilize today. Continue drain for now. Likely to remove in morning. Possible discharge tomorrow depending on mobility level.

## 2013-06-27 NOTE — Progress Notes (Signed)
This patient has been seen and I agree with the findings and treatment plan.  Annastyn Silvey O. Dushawn Pusey, III, MD, FACS [phone] (pager) [phone] (direct pager) Trauma Surgeon  
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
## 2013-06-27 NOTE — Progress Notes (Signed)
Pt being transferred to 4N per doctor's order. Report called. Medications up to date. Family at bedside and updated. Belongings transferred with pt.
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
## 2013-06-28 MED ORDER — HYDROMORPHONE HCL PF 1 MG/ML IJ SOLN: 0.5000 mg | INTRAMUSCULAR | Status: DC | PRN

## 2013-06-28 MED ORDER — OXYCODONE-ACETAMINOPHEN 5-325 MG PO TABS: 1.0000 | ORAL_TABLET | ORAL | Status: DC | PRN

## 2013-06-28 MED ORDER — NAPROXEN 500 MG PO TABS: 500.0000 mg | ORAL_TABLET | Freq: Two times a day (BID) | ORAL | Status: DC
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
## 2013-06-28 NOTE — Care Management Note (Signed)
    Page 1 of 1   06/28/2013     2:25:55 PM   CARE MANAGEMENT NOTE 06/28/2013  Patient:  Amanda CockayneKNOX,CHRIS   Account Number:  [id number]  Date Initiated:  06/28/2013  Documentation initiated by:  Ronny FlurryWILE,Ananth Fiallos  Subjective/Objective Assessment:     Action/Plan:   Anticipated DC Date:  06/28/2013   Anticipated DC Plan:  HOME W HOME HEALTH SERVICES         Choice offered to / List presented to:  C-1 Patient   DME arranged  3-N-1      DME agency  [facility]     HH arranged  HH-2 PT      [facility]   Status of service:  Completed, signed off Medicare Important Message given?   (If response is "NO", the following Medicare IM given date fields will be blank) Date Medicare IM given:   Date Additional Medicare IM given:    Discharge Disposition:    Per UR Regulation:    If discussed at Long Length of Stay Meetings, dates discussed:    Comments:  phone number [phone]
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
## 2013-06-28 NOTE — Progress Notes (Signed)
Should be able to go home today.  This patient has been seen and I agree with the findings and treatment plan.  Marta LamasJames O. Gae BonWyatt, III, MD, FACS [phone] (pager) [phone] (direct pager) Trauma Surgeon
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
## 2013-06-28 NOTE — Discharge Instructions (Signed)
No driving while taking oxycodone.  Keep collar on at all times.  Wash wounds daily in shower with soap and water. Apply antibiotic ointment (e.g. Neosporin) twice daily and as needed to keep moist.

## 2013-06-28 NOTE — Progress Notes (Signed)
.   Patient was able to ambulate at home. Minimal neck pain. No upper were lower extremity symptoms. Overall pain recently well-controlled. Swallowing and eating well. We strong.  Afebrile. Vital stable. Motor and sensory function intact. Wound clean and dry. Next soft. Airway midline. Drain output minimal.  Overall doing well. Fine for discharge home from my standpoint. He needs to follow up with me in 2 weeks. Remain in [place] collar.
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
## 2013-06-28 NOTE — ED Provider Notes (Signed)
I saw and evaluated the patient, reviewed the resident's note and I agree with the findings and plan.   .Face to face Exam:  General:  Awake HEENT: Large scalp laceration Resp:  Normal effort Abd:  Nondistended Neuro:No focal weakness   I was present during laceration repair  CRITICAL CARE Performed by: Nelva NayBEATON,Kinzey Sheriff L Total critical care time: 45 min Critical care time was exclusive of separately billable procedures and treating other patients. Critical care was necessary to treat or prevent imminent or life-threatening deterioration. Critical care was time spent personally by me on the following activities: development of treatment plan with patient and/or surrogate as well as nursing, discussions with consultants, evaluation of patient's response to treatment, examination of patient, obtaining history from patient or surrogate, ordering and performing treatments and interventions, ordering and review of laboratory studies, ordering and review of radiographic studies, pulse oximetry and re-evaluation of patient's condition.   Nelia Shiobert L Shanesha Bednarz, MD 06/28/13 1023

## 2013-07-03 ENCOUNTER — Encounter (INDEPENDENT_AMBULATORY_CARE_PROVIDER_SITE_OTHER): Payer: Self-pay

## 2013-07-03 ENCOUNTER — Ambulatory Visit (INDEPENDENT_AMBULATORY_CARE_PROVIDER_SITE_OTHER): Payer: Commercial Managed Care - PPO | Admitting: General Surgery

## 2013-07-03 VITALS — BP 130/72 | HR 77 | Temp 98.6°F | Resp 18 | Ht 73.0 in | Wt 199.4 lb

## 2013-07-03 DIAGNOSIS — S12600A Unspecified displaced fracture of seventh cervical vertebra, initial encounter for closed fracture: Secondary | ICD-10-CM

## 2013-07-03 DIAGNOSIS — D62 Acute posthemorrhagic anemia: Secondary | ICD-10-CM

## 2013-07-03 DIAGNOSIS — S22009A Unspecified fracture of unspecified thoracic vertebra, initial encounter for closed fracture: Secondary | ICD-10-CM

## 2013-07-03 DIAGNOSIS — S0100XA Unspecified open wound of scalp, initial encounter: Secondary | ICD-10-CM

## 2013-07-03 DIAGNOSIS — S0101XA Laceration without foreign body of scalp, initial encounter: Secondary | ICD-10-CM

## 2013-07-03 DIAGNOSIS — S22019A Unspecified fracture of first thoracic vertebra, initial encounter for closed fracture: Secondary | ICD-10-CM

## 2013-07-03 MED ORDER — OXYCODONE-ACETAMINOPHEN 5-325 MG PO TABS: 1.0000 | ORAL_TABLET | Freq: Four times a day (QID) | ORAL | Status: DC | PRN

## 2013-07-03 NOTE — Patient Instructions (Signed)
1.  Refill Oxycodone, take sparingly 2.  Wash scalp, keep staples in place for now, if you can see the neurosurgeon he can take the staples out.  If not follow up in our office in 1 week 3.  Get a primary care provider so that you can see for your anxiety attacks to try and resume your medications 4.  Make appt with Neurosurgeon
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
## 2013-07-10 ENCOUNTER — Encounter (INDEPENDENT_AMBULATORY_CARE_PROVIDER_SITE_OTHER): Payer: Commercial Managed Care - PPO

## 2015-04-30 ENCOUNTER — Other Ambulatory Visit: Payer: Self-pay | Admitting: Neurosurgery

## 2015-04-30 DIAGNOSIS — S12691A Other nondisplaced fracture of seventh cervical vertebra, initial encounter for closed fracture: Secondary | ICD-10-CM

## 2015-05-07 ENCOUNTER — Ambulatory Visit
Admission: RE | Admit: 2015-05-07 | Discharge: 2015-05-07 | Disposition: A | Discharge status: Home / Self Care | Payer: Self-pay | Source: Ambulatory Visit | Attending: Neurosurgery | Admitting: Neurosurgery

## 2015-05-07 DIAGNOSIS — S12691A Other nondisplaced fracture of seventh cervical vertebra, initial encounter for closed fracture: Secondary | ICD-10-CM

## 2015-09-03 DIAGNOSIS — K709 Alcoholic liver disease, unspecified: Secondary | ICD-10-CM | POA: Diagnosis not present

## 2015-09-03 DIAGNOSIS — R188 Other ascites: Secondary | ICD-10-CM | POA: Diagnosis not present

## 2015-09-04 DIAGNOSIS — E871 Hypo-osmolality and hyponatremia: Secondary | ICD-10-CM | POA: Diagnosis not present

## 2015-09-04 DIAGNOSIS — K709 Alcoholic liver disease, unspecified: Secondary | ICD-10-CM | POA: Diagnosis not present

## 2015-09-04 DIAGNOSIS — R188 Other ascites: Secondary | ICD-10-CM | POA: Diagnosis not present

## 2015-09-22 DIAGNOSIS — R55 Syncope and collapse: Secondary | ICD-10-CM

## 2015-09-22 DIAGNOSIS — D500 Iron deficiency anemia secondary to blood loss (chronic): Secondary | ICD-10-CM | POA: Diagnosis not present

## 2015-09-22 DIAGNOSIS — K766 Portal hypertension: Secondary | ICD-10-CM

## 2015-09-22 DIAGNOSIS — Z87898 Personal history of other specified conditions: Secondary | ICD-10-CM

## 2015-09-22 DIAGNOSIS — K922 Gastrointestinal hemorrhage, unspecified: Secondary | ICD-10-CM

## 2015-09-22 DIAGNOSIS — K7460 Unspecified cirrhosis of liver: Secondary | ICD-10-CM

## 2015-09-23 DIAGNOSIS — K7460 Unspecified cirrhosis of liver: Secondary | ICD-10-CM

## 2015-09-23 DIAGNOSIS — K766 Portal hypertension: Secondary | ICD-10-CM

## 2015-09-23 DIAGNOSIS — K920 Hematemesis: Secondary | ICD-10-CM

## 2015-09-23 DIAGNOSIS — D500 Iron deficiency anemia secondary to blood loss (chronic): Secondary | ICD-10-CM

## 2015-09-23 DIAGNOSIS — Z87898 Personal history of other specified conditions: Secondary | ICD-10-CM

## 2015-09-23 DIAGNOSIS — R55 Syncope and collapse: Secondary | ICD-10-CM

## 2015-09-24 DIAGNOSIS — D500 Iron deficiency anemia secondary to blood loss (chronic): Secondary | ICD-10-CM | POA: Diagnosis not present

## 2015-09-24 DIAGNOSIS — Z87898 Personal history of other specified conditions: Secondary | ICD-10-CM | POA: Diagnosis not present

## 2015-09-24 DIAGNOSIS — K920 Hematemesis: Secondary | ICD-10-CM | POA: Diagnosis not present

## 2015-09-24 DIAGNOSIS — K7460 Unspecified cirrhosis of liver: Secondary | ICD-10-CM | POA: Diagnosis not present

## 2015-09-25 DIAGNOSIS — Z87898 Personal history of other specified conditions: Secondary | ICD-10-CM | POA: Diagnosis not present

## 2015-09-25 DIAGNOSIS — K920 Hematemesis: Secondary | ICD-10-CM | POA: Diagnosis not present

## 2015-09-25 DIAGNOSIS — D500 Iron deficiency anemia secondary to blood loss (chronic): Secondary | ICD-10-CM | POA: Diagnosis not present

## 2015-09-25 DIAGNOSIS — K7460 Unspecified cirrhosis of liver: Secondary | ICD-10-CM | POA: Diagnosis not present

## 2015-09-26 DIAGNOSIS — Z87898 Personal history of other specified conditions: Secondary | ICD-10-CM | POA: Diagnosis not present

## 2015-09-26 DIAGNOSIS — D500 Iron deficiency anemia secondary to blood loss (chronic): Secondary | ICD-10-CM | POA: Diagnosis not present

## 2015-09-26 DIAGNOSIS — K7460 Unspecified cirrhosis of liver: Secondary | ICD-10-CM | POA: Diagnosis not present

## 2015-09-26 DIAGNOSIS — K920 Hematemesis: Secondary | ICD-10-CM | POA: Diagnosis not present

## 2015-09-27 DIAGNOSIS — Z87898 Personal history of other specified conditions: Secondary | ICD-10-CM | POA: Diagnosis not present

## 2015-09-27 DIAGNOSIS — K920 Hematemesis: Secondary | ICD-10-CM | POA: Diagnosis not present

## 2015-09-27 DIAGNOSIS — D500 Iron deficiency anemia secondary to blood loss (chronic): Secondary | ICD-10-CM | POA: Diagnosis not present

## 2015-09-27 DIAGNOSIS — K7460 Unspecified cirrhosis of liver: Secondary | ICD-10-CM | POA: Diagnosis not present

## 2015-09-28 DIAGNOSIS — K7460 Unspecified cirrhosis of liver: Secondary | ICD-10-CM | POA: Diagnosis not present

## 2015-09-28 DIAGNOSIS — K920 Hematemesis: Secondary | ICD-10-CM | POA: Diagnosis not present

## 2015-09-28 DIAGNOSIS — Z87898 Personal history of other specified conditions: Secondary | ICD-10-CM | POA: Diagnosis not present

## 2015-09-28 DIAGNOSIS — D500 Iron deficiency anemia secondary to blood loss (chronic): Secondary | ICD-10-CM | POA: Diagnosis not present

## 2017-07-13 ENCOUNTER — Telehealth: Payer: Self-pay

## 2017-07-13 NOTE — Telephone Encounter (Signed)
Patient would like a refill on Lactulose, how many would you like to give him?

## 2017-07-14 NOTE — Telephone Encounter (Signed)
Pt is calling back about his Lactulose refill

## 2017-07-14 NOTE — Telephone Encounter (Signed)
Patient is calling back for medication states his insurance is requiring a prior auth for medication and he is completely out of medication. Best call back #[phone].

## 2017-07-17 NOTE — Telephone Encounter (Signed)
Never heard of lactulose requiring precert Can you please look into it Lactulose 30 cc (10 g per 15 mL) p.o. twice daily x 1 month, 11 refills

## 2017-07-18 MED ORDER — LACTULOSE 10 GM/15ML PO SOLN: 20.0000 g | Freq: Two times a day (BID) | ORAL | 11 refills | Status: DC

## 2017-07-18 NOTE — Telephone Encounter (Signed)
Sent refills in to patients pharmacy.  

## 2017-07-19 ENCOUNTER — Telehealth: Payer: Self-pay | Admitting: Gastroenterology

## 2017-07-19 MED ORDER — LACTULOSE 10 GM/15ML PO SOLN: 20.0000 g | Freq: Two times a day (BID) | ORAL | 11 refills | Status: DC

## 2017-07-19 NOTE — Telephone Encounter (Signed)
Sent medication to [facility].

## 2017-07-25 NOTE — Telephone Encounter (Signed)
Patient states he was only prescribed one bottle of medication lactulose and is out already. Patient states he is usually given 10-12 bottles at a time. Patient wanting to know if new prescription can be sent in again to [facility] and pt be called back when done.

## 2017-07-26 MED ORDER — LACTULOSE 10 GM/15ML PO SOLN: 20.0000 g | Freq: Two times a day (BID) | ORAL | 11 refills | Status: DC

## 2017-07-26 NOTE — Addendum Note (Signed)
Addended by: Junius RoadsWILLIAMS, Jaja Switalski J on: 07/26/2017 01:50 PM   Modules accepted: Orders

## 2017-10-17 ENCOUNTER — Encounter: Payer: Self-pay | Admitting: Gastroenterology

## 2017-10-26 ENCOUNTER — Encounter: Payer: Self-pay | Admitting: Gastroenterology

## 2017-11-20 ENCOUNTER — Telehealth: Payer: Self-pay | Admitting: Gastroenterology

## 2017-11-20 MED ORDER — OMEPRAZOLE 20 MG PO CPDR: 20.0000 mg | DELAYED_RELEASE_CAPSULE | Freq: Every day | ORAL | 3 refills | Status: AC

## 2017-11-20 NOTE — Telephone Encounter (Signed)
Patient states he needs a refill of medication omeprazole sent to [facility] in [place].

## 2017-11-20 NOTE — Telephone Encounter (Signed)
Sent refills to patients pharmacy.  

## 2017-11-27 ENCOUNTER — Other Ambulatory Visit: Payer: Self-pay

## 2017-11-27 ENCOUNTER — Ambulatory Visit (AMBULATORY_SURGERY_CENTER): Payer: Self-pay | Admitting: *Deleted

## 2017-11-27 VITALS — Ht 73.0 in | Wt 239.8 lb

## 2017-11-27 DIAGNOSIS — I8500 Esophageal varices without bleeding: Secondary | ICD-10-CM

## 2017-11-27 NOTE — Progress Notes (Signed)
No egg or soy allergy known to patient  No issues with past sedation with any surgeries  or procedures, no intubation problems  No diet pills per patient No home 02 use per patient  No blood thinners per patient  No A fib or A flutter  EMMI video sent to pt's e mail  Patient stating he does have difficulty with swallowing pills at times. Patient had a cervical fusion in the past. Patient is able to move his neck in  Flexion center and left to right. Patient is able to extend his neck without difficulty. Witness by myself and R. Izola PriceMyers RN

## 2017-12-08 ENCOUNTER — Telehealth: Payer: Self-pay | Admitting: Gastroenterology

## 2017-12-11 ENCOUNTER — Encounter: Payer: Self-pay | Admitting: Gastroenterology

## 2017-12-11 ENCOUNTER — Ambulatory Visit (AMBULATORY_SURGERY_CENTER): Payer: Medicare Other | Admitting: Gastroenterology

## 2017-12-11 VITALS — BP 136/76 | HR 76 | Temp 97.5°F | Resp 28 | Ht 73.0 in | Wt 239.0 lb

## 2017-12-11 DIAGNOSIS — K2900 Acute gastritis without bleeding: Secondary | ICD-10-CM | POA: Diagnosis not present

## 2017-12-11 DIAGNOSIS — I8510 Secondary esophageal varices without bleeding: Secondary | ICD-10-CM | POA: Diagnosis not present

## 2017-12-11 DIAGNOSIS — K319 Disease of stomach and duodenum, unspecified: Secondary | ICD-10-CM

## 2017-12-11 MED ORDER — SODIUM CHLORIDE 0.9 % IV SOLN: 500.0000 mL | Freq: Once | INTRAVENOUS | Status: DC

## 2017-12-11 MED ORDER — RIFAXIMIN 550 MG PO TABS: 550.0000 mg | ORAL_TABLET | Freq: Two times a day (BID) | ORAL | 11 refills | Status: DC

## 2017-12-11 NOTE — Telephone Encounter (Signed)
Please refill.

## 2017-12-11 NOTE — Telephone Encounter (Signed)
Sent refills to patients pharmacy.  

## 2017-12-11 NOTE — Patient Instructions (Signed)
YOU HAD AN ENDOSCOPIC PROCEDURE TODAY AT THE [facility]:   Refer to the procedure report that was given to you for any specific questions about what was found during the examination.  If the procedure report does not answer your questions, please call your gastroenterologist to clarify.  If you requested that your care partner not be given the details of your procedure findings, then the procedure report has been included in a sealed envelope for you to review at your convenience later.  YOU SHOULD EXPECT: Some feelings of bloating in the abdomen. Passage of more gas than usual.  Walking can help get rid of the air that was put into your GI tract during the procedure and reduce the bloating. If you had a lower endoscopy (such as a colonoscopy or flexible sigmoidoscopy) you may notice spotting of blood in your stool or on the toilet paper. If you underwent a bowel prep for your procedure, you may not have a normal bowel movement for a few days.  Please Note:  You might notice some irritation and congestion in your nose or some drainage.  This is from the oxygen used during your procedure.  There is no need for concern and it should clear up in a day or so.  SYMPTOMS TO REPORT IMMEDIATELY:    Following upper endoscopy (EGD)  Vomiting of blood or coffee ground material  New chest pain or pain under the shoulder blades  Painful or persistently difficult swallowing  New shortness of breath  Fever of 100F or higher  Black, tarry-looking stools   Please see handouts given to you on Gastritis. Please continue to take Omeprazole 20 mg daily.  For urgent or emergent issues, a gastroenterologist can be reached at any hour by calling [phone].   DIET:  We do recommend a small meal at first, but then you may proceed to your regular diet.  Drink plenty of fluids but you should avoid alcoholic beverages for 24 hours.  ACTIVITY:  You should plan to take it easy for the rest of today  and you should NOT DRIVE or use heavy machinery until tomorrow (because of the sedation medicines used during the test).    FOLLOW UP: Our staff will call the number listed on your records the next business day following your procedure to check on you and address any questions or concerns that you may have regarding the information given to you following your procedure. If we do not reach you, we will leave a message.  However, if you are feeling well and you are not experiencing any problems, there is no need to return our call.  We will assume that you have returned to your regular daily activities without incident.  If any biopsies were taken you will be contacted by phone or by letter within the next 1-3 weeks.  Please call us at [phone] if you have not heard about the biopsies in 3 weeks.    SIGNATURES/CONFIDENTIALITY: You and/or your care partner have signed paperwork which will be entered into your electronic medical record.  These signatures attest to the fact that that the information above on your After Visit Summary has been reviewed and is understood.  Full responsibility of the confidentiality of this discharge information lies with you and/or your care-partner.  Thank you for letting us take care of your healthcare needs today.

## 2017-12-11 NOTE — Op Note (Signed)
[facility] Patient Name: Tim Hunter Procedure Date: 12/11/2017 10:17 AM MRN: [id number] Endoscopist: Jackquline Denmark , MD Age: 47 Referring MD:  Date of Birth: 10/21/1970 Gender: Male Account #: [id number] Procedure:                Upper GI endoscopy Indications:              Suspected esophageal varices in patient with H/O                            alcoholic liver cirrhosis with ascites Medicines:                Monitored Anesthesia Care Procedure:                Pre-Anesthesia Assessment:                           - Prior to the procedure, a History and Physical                            was performed, and patient medications and                            allergies were reviewed. The patient is competent.                            The risks and benefits of the procedure and the                            sedation options and risks were discussed with the                            patient. All questions were answered and informed                            consent was obtained. Patient identification and                            proposed procedure were verified by the physician                            in the pre-procedure area. Mental Status                            Examination: alert and oriented. Airway                            Examination: normal oropharyngeal airway and neck                            mobility. Prophylactic Antibiotics: The patient                            does not require prophylactic antibiotics. Prior  Anticoagulants: The patient has taken no previous                            anticoagulant or antiplatelet agents. ASA Grade                            Assessment: II - A patient with mild systemic                            disease. After reviewing the risks and benefits,                            the patient was deemed in satisfactory condition to                            undergo the procedure. The  anesthesia plan was to                            use monitored anesthesia care (MAC). Immediately                            prior to administration of medications, the patient                            was re-assessed for adequacy to receive sedatives.                            The heart rate, respiratory rate, oxygen                            saturations, blood pressure, adequacy of pulmonary                            ventilation, and response to care were monitored                            throughout the procedure. The physical status of                            the patient was re-assessed after the procedure.                           After obtaining informed consent, the endoscope was                            passed under direct vision. Throughout the                            procedure, the patient's blood pressure, pulse, and                            oxygen saturations were monitored continuously. The  Endoscope was introduced through the mouth, and                            advanced to the second part of duodenum. The upper                            GI endoscopy was accomplished without difficulty.                            The patient tolerated the procedure well. Scope In: Scope Out: Findings:                 The examined esophagus was normal. No esophageal or                            fundal varices                           Localized mild inflammation characterized by                            erythema was found in the gastric antrum. Biopsies                            were taken with a cold forceps for histology.                           A single umbilicated, submucosal lesion measuring                            10 mm in diameter was found in the gastric antrum.                            Biopsies were taken with a cold forceps for                            histology. Estimated blood loss was minimal.                            The in the duodenum was normal. Complications:            No immediate complications. Estimated Blood Loss:     Estimated blood loss: none. Impression:               - No esophageal or fundal varices.                           - Mild gastritis. Biopsied.                           - A single lesion suspicious for aberrant pancreas                            was found in the stomach. Biopsied. Recommendation:           - Patient has a contact number available  for                            emergencies. The signs and symptoms of potential                            delayed complications were discussed with the                            patient. Return to normal activities tomorrow.                            Written discharge instructions were provided to the                            patient.                           - Resume previous diet.                           - Continue present medications.                           - Await pathology results.                           - Return to my office in 12 weeks. Jackquline Denmark, MD 12/11/2017 10:39:06 AM This report has been signed electronically.

## 2017-12-11 NOTE — Progress Notes (Signed)
Called to room to assist during endoscopic procedure.  Patient ID and intended procedure confirmed with present staff. Received instructions for my participation in the procedure from the performing physician.  

## 2017-12-11 NOTE — Progress Notes (Signed)
Pt's states no medical or surgical changes since previsit or office visit. 

## 2017-12-11 NOTE — Telephone Encounter (Signed)
Would you like to refill this? ?

## 2017-12-11 NOTE — Progress Notes (Signed)
Alert and oriented x3, pleased with MAC, report to RN  

## 2017-12-12 ENCOUNTER — Telehealth: Payer: Self-pay

## 2017-12-12 ENCOUNTER — Telehealth: Payer: Self-pay | Admitting: *Deleted

## 2017-12-12 NOTE — Telephone Encounter (Signed)
  Follow up Call-  Call back number 12/11/2017  Post procedure Call Back phone  # [phone]  Permission to leave phone message Yes  Some recent data might be hidden     Patient questions:  Do you have a fever, pain , or abdominal swelling? No. Pain Score  0 *  Have you tolerated food without any problems? Yes.    Have you been able to return to your normal activities? Yes.    Do you have any questions about your discharge instructions: Diet   No. Medications  No. Follow up visit  No.  Do you have questions or concerns about your Care? No.  Actions: * If pain score is 4 or above: No action needed, pain <4.

## 2017-12-12 NOTE — Telephone Encounter (Signed)
No answer, left message to call if question or concerns. 

## 2017-12-14 ENCOUNTER — Encounter: Payer: Self-pay | Admitting: Gastroenterology

## 2018-03-20 ENCOUNTER — Telehealth: Payer: Self-pay | Admitting: Gastroenterology

## 2018-03-21 NOTE — Telephone Encounter (Signed)
Called and spoke with patient-patient reports he needs to make his follow up visit appt after his upper gi endo- appt made for patient for 04/11/18 arrival at 8:15 am and appt at 8:30 am; patient verbalized understanding of information/instructions; Patient was advised to call back if questions/concerns arise;

## 2018-04-11 ENCOUNTER — Other Ambulatory Visit (INDEPENDENT_AMBULATORY_CARE_PROVIDER_SITE_OTHER): Payer: Medicare Other

## 2018-04-11 ENCOUNTER — Encounter: Payer: Self-pay | Admitting: Gastroenterology

## 2018-04-11 ENCOUNTER — Ambulatory Visit (INDEPENDENT_AMBULATORY_CARE_PROVIDER_SITE_OTHER): Payer: Medicare Other | Admitting: Gastroenterology

## 2018-04-11 VITALS — BP 146/88 | HR 86 | Ht 73.0 in | Wt 242.0 lb

## 2018-04-11 DIAGNOSIS — K7460 Unspecified cirrhosis of liver: Secondary | ICD-10-CM | POA: Diagnosis not present

## 2018-04-11 LAB — COMPREHENSIVE METABOLIC PANEL
ALT: 16 U/L (ref 0–53)
AST: 23 U/L (ref 0–37)
Albumin: 4.1 g/dL (ref 3.5–5.2)
Alkaline Phosphatase: 58 U/L (ref 39–117)
BUN: 7 mg/dL (ref 6–23)
CO2: 29 mEq/L (ref 19–32)
CREATININE: 0.73 mg/dL (ref 0.40–1.50)
Calcium: 9.3 mg/dL (ref 8.4–10.5)
Chloride: 102 mEq/L (ref 96–112)
GFR: 122.09 mL/min (ref >60.00)
Glucose, Bld: 100 mg/dL — ABNORMAL HIGH (ref 70–99)
Potassium: 4.3 mEq/L (ref 3.5–5.1)
SODIUM: 138 meq/L (ref 135–145)
Total Bilirubin: 0.4 mg/dL (ref 0.2–1.2)
Total Protein: 7.2 g/dL (ref 6.0–8.3)

## 2018-04-11 LAB — CBC WITH DIFFERENTIAL/PLATELET
Basophils Absolute: 0 10*3/uL (ref 0.0–0.1)
Basophils Relative: 0.7 % (ref 0.0–3.0)
Eosinophils Absolute: 0.1 10*3/uL (ref 0.0–0.7)
Eosinophils Relative: 1.4 % (ref 0.0–5.0)
HCT: 41 % (ref 39.0–52.0)
Hemoglobin: 13.6 g/dL (ref 13.0–17.0)
LYMPHS ABS: 1.3 10*3/uL (ref 0.7–4.0)
Lymphocytes Relative: 28.1 % (ref 12.0–46.0)
MCHC: 33.2 g/dL (ref 30.0–36.0)
MCV: 96.3 fl (ref 78.0–100.0)
MONO ABS: 0.5 10*3/uL (ref 0.1–1.0)
Monocytes Relative: 11.4 % (ref 3.0–12.0)
Neutro Abs: 2.7 10*3/uL (ref 1.4–7.7)
Neutrophils Relative %: 58.4 % (ref 43.0–77.0)
Platelets: 139 10*3/uL — ABNORMAL LOW (ref 150.0–400.0)
RBC: 4.26 Mil/uL (ref 4.22–5.81)
RDW: 14.1 % (ref 11.5–15.5)
WBC: 4.6 10*3/uL (ref 4.0–10.5)

## 2018-04-11 LAB — PROTIME-INR
INR: 1 ratio (ref 0.8–1.0)
Prothrombin Time: 12.2 s (ref 9.6–13.1)

## 2018-04-11 LAB — AMMONIA: Ammonia: 45 umol/L — ABNORMAL HIGH (ref 11–35)

## 2018-04-11 MED ORDER — RIFAXIMIN 550 MG PO TABS: 550.0000 mg | ORAL_TABLET | Freq: Two times a day (BID) | ORAL | 11 refills | Status: DC

## 2018-04-11 NOTE — Patient Instructions (Signed)
If you are age 48 or older, your body mass index should be between 23-30. Your Body mass index is 31.93 kg/m. If this is out of the aforementioned range listed, please consider follow up with your Primary Care Provider.  If you are age 57 or younger, your body mass index should be between 19-25. Your Body mass index is 31.93 kg/m. If this is out of the aformentioned range listed, please consider follow up with your Primary Care Provider.   You have been scheduled for an abdominal ultrasound with elastography at [facility] (1st floor). Your appointment is scheduled for 04/17/18 at 10am. Please arrive 15 minutes prior to your scheduled appointment for registration purposes. Make certain not to have anything to eat or drink 6 hours prior to your procedure. Should you need to reschedule your appointment, you may contact radiology at [phone].  Liver Elastography Various chronic liver diseases such as hepatitis B, C, and fatty liver disease can lead to tissue damage and subsequent scar tissue formation. As the scar tissue accumulates, the liver loses some of its elasticity and becomes stiffer. Liver elastography involves the use of a surface ultrasound probe that delivers a low frequency pulse or shear wave to a small volume of liver tissue under the rib cage. The transmission of the sound wave is completely painless. How Is a Liver Elastography Performed? The liver is located in the right upper abdomen under the rib cage. Patients are asked to lie flat on an examination table. A technician places the FibroScan probe between the ribs on the right side of the lower chest wall. A series of 10 painless pulses are then applied to the liver. The results are recorded on the equipment and an overall liver stiffness score is generated. This score is then interpreted by a qualified physician to predict the likelihood of advanced fibrosis or cirrhosis.  Patients are asked to wear loose clothing and should  not consume any liquids or solids for a minimum of 4 hours before the test to increase the likelihood of obtaining reliable test results. The scan will take 10 to 15 minutes to complete, but patients should plan on being available for 30 minutes to allow time for preparation  Please go to the lab on the 2nd floor [address] before you leave the office today.   Thank you,  Dr. Lynann Bologna

## 2018-04-11 NOTE — Progress Notes (Addendum)
Chief Complaint: FU  Referring Provider:  Leanna Sato, MD      ASSESSMENT AND PLAN;   #1.  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 Dx 08/2015. Neg acute hep profile.  Last alcohol drink 08/2015. EGD 12/2017 neg for esophageal varices.  H/O hepatic encephalopathy, coagulopathy #2.  Asymptomatic cholelithiasis. #3.  Screening for HCC. #4.  H/O UGI bleeding d/t gastric ulcer 09/2015.  Plan: - [place] with hepatic elastography. - Check CBC, CMP, ammonia, PT, AFP today. - Continue rifaximin 550 mg p.o. twice daily and lactulose 30 mL p.o. twice daily (titrate to 2-3 bowel movements per day). - Continue Lasix 40 mg p.o. once a day and spironolactone 50 mg p.o. once a day. - Continue omeprazole 20 mg p.o. once a day. - FU in 6 months.  Earlier, if with any new problems.  HPI:    Tim Hunter is a 48 y.o. male  For follow-up visit Status post EGD 12/11/2017-negative for esophageal varices. No further ascites. Has been compliant with medications. No further alcohol. Has bowel movements at the frequency of 2 to 3/day. No nausea, vomiting, heartburn, regurgitation, odynophagia or dysphagia.  No significant constipation.  There is no melena or hematochezia. No unintentional weight loss.   Past Medical History:  Diagnosis Date  . Anxiety   . Blood transfusion without reported diagnosis   . GERD (gastroesophageal reflux disease)   . High cholesterol   . Hypertension     Past Surgical History:  Procedure Laterality Date  . ANTERIOR CERVICAL DECOMP/DISCECTOMY FUSION N/A 06/26/2013   Procedure: ANTERIOR CERVICAL DECOMPRESSION/DISCECTOMY FUSION 1 LEVEL/HARDWARE REMOVAL;  Surgeon: Tim Pacini, MD;  Location: [facility];  Service: Neurosurgery;  Laterality: N/A;  Cervical seven - Thoracic one Anterior Cervical Decompression Fusion    Family History  Problem Relation Age of Onset  . Prostate cancer Father   . Colon cancer Neg Hx   . Colon polyps Neg Hx   . Esophageal  cancer Neg Hx   . Rectal cancer Neg Hx   . Stomach cancer Neg Hx     Social History   Tobacco Use  . Smoking status: Never Smoker  . Smokeless tobacco: Never Used  Substance Use Topics  . Alcohol use: Not Currently  . Drug use: Never    Current Outpatient Medications  Medication Sig Dispense Refill  . atorvastatin (LIPITOR) 20 MG tablet Take 20 mg by mouth daily.  6  . Cyanocobalamin (VITAMIN B-12 PO) Take by mouth.    . furosemide (LASIX) 40 MG tablet Take by mouth.    . lactulose (CHRONULAC) 10 GM/15ML solution Take 30 mLs (20 g total) by mouth 2 (two) times daily. 1800 mL 11  . omeprazole (PRILOSEC) 20 MG capsule Take 1 capsule (20 mg total) by mouth daily. 90 capsule 3  . oxyCODONE-acetaminophen (ROXICET) 5-325 MG per tablet Take 1-2 tablets by mouth every 6 (six) hours as needed (Pain). 20 tablet 0  . rifaximin (XIFAXAN) 550 MG TABS tablet Take 1 tablet (550 mg total) by mouth 2 (two) times daily. 60 tablet 11  . sildenafil (REVATIO) 20 MG tablet Take 20 mg by mouth daily.     Marland Kitchen spironolactone (ALDACTONE) 50 MG tablet Take by mouth.     No current facility-administered medications for this visit.     No Known Allergies  Review of Systems:  neg     Physical Exam:    BP (!) 146/88   Pulse 86   Ht 6'  1" (1.854 Hunter)   Wt 242 lb (109.8 kg)   BMI 31.93 kg/Hunter  Filed Weights   04/11/18 0829  Weight: 242 lb (109.8 kg)   Constitutional:  Well-developed, in no acute distress. Psychiatric: Normal mood and affect. Behavior is normal. HEENT: Pupils normal.  Conjunctivae are normal. No scleral icterus. Neck supple.  Cardiovascular: Normal rate, regular rhythm. No edema Pulmonary/chest: Effort normal and breath sounds normal. No wheezing, rales or rhonchi. Abdominal: Soft, nondistended. Nontender. Bowel sounds active throughout. There are no masses palpable. No hepatomegaly. Has umbilical hernia-reducible. Rectal:  defered Neurological: Alert and oriented to person place  and time. Skin: Skin is warm and dry. No rashes noted.  Data Reviewed: I have personally reviewed following labs and imaging studies  CBC: CBC Latest Ref Rng & Units 06/26/2013 06/25/2013 06/25/2013  WBC 4.0 - 10.5 K/uL 6.3 - 6.0  Hemoglobin 13.0 - 17.0 g/dL 12.1(L) 16.3 14.9  Hematocrit 39.0 - 52.0 % 34.9(L) 48.0 41.4  Platelets 150 - 400 K/uL 132(L) - 160    CMP: CMP Latest Ref Rng & Units 06/26/2013 06/25/2013 06/25/2013  Glucose 70 - 99 mg/dL 161(W115(H) 960(A124(H) 540(J117(H)  BUN 6 - 23 mg/dL 6 5(L) 6  Creatinine 8.110.50 - 1.35 mg/dL 9.140.67 7.821.30 9.560.70  Sodium 137 - 147 mEq/L 140 138 137  Potassium 3.7 - 5.3 mEq/L 4.5 6.0(H) 5.0  Chloride 96 - 112 mEq/L 99 101 94(L)  CO2 19 - 32 mEq/L 19 - 19  Calcium 8.4 - 10.5 mg/dL 7.9(L) - 8.5  Total Protein 6.0 - 8.3 g/dL - - 8.6(H)  Total Bilirubin 0.3 - 1.2 mg/dL - - 0.5  Alkaline Phos 39 - 117 U/L - - 105  AST 0 - 37 U/L - - 194(H)  ALT 0 - 53 U/L - - 78(H)  I spent 25 minutes of face-to-face time with the patient. Greater than 50% of the time was spent counseling and coordinating care.     Tim Circleaj Fayez Sturgell, MD 04/11/2018, 8:51 AM  Cc: Tim SatoMiles, Tim M, MD

## 2018-04-12 LAB — AFP TUMOR MARKER: AFP-Tumor Marker: 4.4 ng/mL (ref <6.1)

## 2018-04-13 ENCOUNTER — Other Ambulatory Visit: Payer: Self-pay

## 2018-04-17 ENCOUNTER — Ambulatory Visit (HOSPITAL_COMMUNITY)
Admission: RE | Admit: 2018-04-17 | Discharge: 2018-04-17 | Disposition: A | Discharge status: Home / Self Care | Payer: Medicare Other | Source: Ambulatory Visit | Attending: Gastroenterology | Admitting: Gastroenterology

## 2018-04-17 DIAGNOSIS — K7460 Unspecified cirrhosis of liver: Secondary | ICD-10-CM | POA: Diagnosis not present

## 2018-09-13 ENCOUNTER — Other Ambulatory Visit: Payer: Self-pay | Admitting: Gastroenterology

## 2018-09-13 ENCOUNTER — Telehealth: Payer: Self-pay | Admitting: Gastroenterology

## 2018-09-13 NOTE — Telephone Encounter (Signed)
Medication has been refilled.

## 2018-09-13 NOTE — Telephone Encounter (Signed)
Patient called in needing to refill medication. °

## 2019-01-14 ENCOUNTER — Telehealth: Payer: Self-pay | Admitting: Gastroenterology

## 2019-01-14 NOTE — Telephone Encounter (Signed)
Next step in patient's plan of care- [place] abd, egd, or f/u appt first? Please advise

## 2019-01-15 NOTE — Telephone Encounter (Signed)
FU first RG

## 2019-01-16 NOTE — Telephone Encounter (Signed)
Ameila-please call this patient and schedule him for an OV prior to having a scan done

## 2019-01-24 ENCOUNTER — Other Ambulatory Visit: Payer: Self-pay

## 2019-01-24 ENCOUNTER — Ambulatory Visit (INDEPENDENT_AMBULATORY_CARE_PROVIDER_SITE_OTHER): Payer: Medicare Other | Admitting: Gastroenterology

## 2019-01-24 ENCOUNTER — Encounter: Payer: Self-pay | Admitting: Gastroenterology

## 2019-01-24 ENCOUNTER — Other Ambulatory Visit (INDEPENDENT_AMBULATORY_CARE_PROVIDER_SITE_OTHER): Payer: Medicare Other

## 2019-01-24 VITALS — BP 148/98 | HR 89 | Temp 98.0°F | Ht 73.0 in | Wt 236.2 lb

## 2019-01-24 DIAGNOSIS — K7460 Unspecified cirrhosis of liver: Secondary | ICD-10-CM | POA: Diagnosis not present

## 2019-01-24 LAB — COMPREHENSIVE METABOLIC PANEL
ALT: 41 U/L (ref 0–53)
AST: 65 U/L — ABNORMAL HIGH (ref 0–37)
Albumin: 4.5 g/dL (ref 3.5–5.2)
Alkaline Phosphatase: 102 U/L (ref 39–117)
BUN: 7 mg/dL (ref 6–23)
CO2: 29 mEq/L (ref 19–32)
Calcium: 9.2 mg/dL (ref 8.4–10.5)
Chloride: 95 mEq/L — ABNORMAL LOW (ref 96–112)
Creatinine, Ser: 0.72 mg/dL (ref 0.40–1.50)
GFR: 116.32 mL/min (ref >60.00)
Glucose, Bld: 85 mg/dL (ref 70–99)
Potassium: 4.4 mEq/L (ref 3.5–5.1)
Sodium: 134 mEq/L — ABNORMAL LOW (ref 135–145)
Total Bilirubin: 0.8 mg/dL (ref 0.2–1.2)
Total Protein: 8.2 g/dL (ref 6.0–8.3)

## 2019-01-24 LAB — CBC WITH DIFFERENTIAL/PLATELET
Basophils Absolute: 0 10*3/uL (ref 0.0–0.1)
Basophils Relative: 0.2 % (ref 0.0–3.0)
Eosinophils Absolute: 0.1 10*3/uL (ref 0.0–0.7)
Eosinophils Relative: 2.3 % (ref 0.0–5.0)
HCT: 44.7 % (ref 39.0–52.0)
Hemoglobin: 15.4 g/dL (ref 13.0–17.0)
Lymphocytes Relative: 37.3 % (ref 12.0–46.0)
Lymphs Abs: 2.4 10*3/uL (ref 0.7–4.0)
MCHC: 34.4 g/dL (ref 30.0–36.0)
MCV: 99.6 fl (ref 78.0–100.0)
Monocytes Absolute: 0.6 10*3/uL (ref 0.1–1.0)
Monocytes Relative: 8.7 % (ref 3.0–12.0)
Neutro Abs: 3.3 10*3/uL (ref 1.4–7.7)
Neutrophils Relative %: 51.5 % (ref 43.0–77.0)
Platelets: 151 10*3/uL (ref 150.0–400.0)
RBC: 4.49 Mil/uL (ref 4.22–5.81)
RDW: 14.4 % (ref 11.5–15.5)
WBC: 6.4 10*3/uL (ref 4.0–10.5)

## 2019-01-24 LAB — PROTIME-INR
INR: 1.1 ratio — ABNORMAL HIGH (ref 0.8–1.0)
Prothrombin Time: 12.3 s (ref 9.6–13.1)

## 2019-01-24 LAB — AMMONIA: Ammonia: 40 umol/L — ABNORMAL HIGH (ref 11–35)

## 2019-01-24 NOTE — Progress Notes (Signed)
Chief Complaint: FU  Referring Provider:  Barnie Mort, NP      ASSESSMENT AND PLAN;   #1.  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 Dx 08/2015. Neg acute hep profile.  Last alcohol drink 08/2015. EGD 12/2017 neg for esophageal varices.  H/O hepatic encephalopathy, coagulopathy. USE 04/2018 F2/ some F3 (no kPa measurement reported). #2.  Asymptomatic cholelithiasis. #3.  Screening for [place]. #4.  H/O UGI bleeding d/t gastric ulcer 09/2015.  Plan: - Check CBC, CMP, ammonia, PT, AFP today. - Continue rifaximin 550 mg p.o. twice daily and lactulose 30 mL p.o. twice daily (titrate to 2-3 bowel movements per day). - salt rest diet, Continue Lasix 40 mg p.o. once a day and spironolactone 50 mg p.o. once a day. - Add Zinc 50 qd - Less red meat - FU in 6 months.  Earlier, if with any new problems. At Next visit-ultrasound. - Exercise like walking 30 minutes every day as he has been doing already. - Doesn't want to pursue transplant as he is doing great.  We will discuss more at follow-up visit as well. - Next EGD due in 12/2020.  Screening colonoscopy at age 44.  HPI:    Tim Hunter is a 48 y.o. male  For follow-up visit Status post EGD 12/11/2017-negative for esophageal varices. No further ascites. Has been compliant with medications. No further alcohol. Has bowel movements at the frequency of 2 to 3/day. No nausea, vomiting, heartburn, regurgitation, odynophagia or dysphagia.  No significant constipation.  There is no melena or hematochezia. No unintentional weight loss. Exercising every day. Has stopped omeprazole. No nonsteroidals. Does not want to pursue liver transplant evaluation.  Has been doing great.  Wt Readings from Last 3 Encounters:  01/24/19 236 lb 4 oz (107.2 kg)  04/11/18 242 lb (109.8 kg)  12/11/17 239 lb (108.4 kg)    Past Medical History:  Diagnosis Date  . Anxiety   . Blood transfusion without reported diagnosis   . GERD (gastroesophageal  reflux disease)   . High cholesterol   . Hypertension     Past Surgical History:  Procedure Laterality Date  . ANTERIOR CERVICAL DECOMP/DISCECTOMY FUSION N/A 06/26/2013   Procedure: ANTERIOR CERVICAL DECOMPRESSION/DISCECTOMY FUSION 1 LEVEL/HARDWARE REMOVAL;  Surgeon: Charlie Pitter, MD;  Location: [facility];  Service: Neurosurgery;  Laterality: N/A;  Cervical seven - Thoracic one Anterior Cervical Decompression Fusion    Family History  Problem Relation Age of Onset  . Prostate cancer Father   . Colon cancer Neg Hx   . Colon polyps Neg Hx   . Esophageal cancer Neg Hx   . Rectal cancer Neg Hx   . Stomach cancer Neg Hx     Social History   Tobacco Use  . Smoking status: Never Smoker  . Smokeless tobacco: Never Used  Substance Use Topics  . Alcohol use: Not Currently  . Drug use: Never    Current Outpatient Medications  Medication Sig Dispense Refill  . atorvastatin (LIPITOR) 20 MG tablet Take 20 mg by mouth daily.  6  . CONSTULOSE 10 GM/15ML solution TAKE 30ML BY MOUTH TWICE DAILY 1800 mL 4  . Cyanocobalamin (VITAMIN B-12 PO) Take by mouth.    . furosemide (LASIX) 40 MG tablet Take by mouth.    Marland Kitchen omeprazole (PRILOSEC) 20 MG capsule Take 1 capsule (20 mg total) by mouth daily. 90 capsule 3  . rifaximin (XIFAXAN) 550 MG TABS tablet Take 1 tablet (550 mg total) by mouth 2 (  two) times daily. 60 tablet 11  . sildenafil (REVATIO) 20 MG tablet Take 20 mg by mouth daily.     Marland Kitchen spironolactone (ALDACTONE) 50 MG tablet Take by mouth.     No current facility-administered medications for this visit.     No Known Allergies  Review of Systems:  neg     Physical Exam:    BP (!) 148/98   Pulse 89   Temp 98 F (36.7 C)   Ht 6\' 1"  (1.854 m)   Wt 236 lb 4 oz (107.2 kg)   SpO2 94%   BMI 31.17 kg/m  Filed Weights   01/24/19 1049  Weight: 236 lb 4 oz (107.2 kg)   Constitutional:  Well-developed, in no acute distress. Psychiatric: Normal mood and affect. Behavior is normal.  HEENT: Pupils normal.  Conjunctivae are normal. No scleral icterus. Neck supple.  Cardiovascular: Normal rate, regular rhythm. No edema Pulmonary/chest: Effort normal and breath sounds normal. No wheezing, rales or rhonchi. Abdominal: Soft, nondistended. Nontender. Bowel sounds active throughout. There are no masses palpable. No hepatomegaly. Has umbilical hernia-reducible. Rectal:  defered Neurological: Alert and oriented to person place and time. Skin: Skin is warm and dry. No rashes noted.  Data Reviewed: I have personally reviewed following labs and imaging studies  CBC: CBC Latest Ref Rng & Units 04/11/2018 06/26/2013 06/25/2013  WBC 4.0 - 10.5 K/uL 4.6 6.3 -  Hemoglobin 13.0 - 17.0 g/dL 06/27/2013 12.1(L) 16.3  Hematocrit 39.0 - 52.0 % 41.0 34.9(L) 48.0  Platelets 150.0 - 400.0 K/uL 139.0(L) 132(L) -    CMP: CMP Latest Ref Rng & Units 04/11/2018 06/26/2013 06/25/2013  Glucose 70 - 99 mg/dL 06/27/2013) 263(Z) 858(I)  BUN 6 - 23 mg/dL 7 6 5(L)  Creatinine 502(D - 1.50 mg/dL 7.41 2.87 8.67  Sodium 135 - 145 mEq/L 138 140 138  Potassium 3.5 - 5.1 mEq/L 4.3 4.5 6.0(H)  Chloride 96 - 112 mEq/L 102 99 101  CO2 19 - 32 mEq/L 29 19 -  Calcium 8.4 - 10.5 mg/dL 9.3 7.9(L) -  Total Protein 6.0 - 8.3 g/dL 7.2 - -  Total Bilirubin 0.2 - 1.2 mg/dL 0.4 - -  Alkaline Phos 39 - 117 U/L 58 - -  AST 0 - 37 U/L 23 - -  ALT 0 - 53 U/L 16 - -  I spent 25 minutes of face-to-face time with the patient. Greater than 50% of the time was spent counseling and coordinating care.     6.72, MD 01/24/2019, 11:08 AM  Cc: 01/26/2019, NP

## 2019-01-24 NOTE — Patient Instructions (Addendum)
If you are age 48 or older, your body mass index should be between 23-30. Your Body mass index is 31.17 kg/m. If this is out of the aforementioned range listed, please consider follow up with your Primary Care Provider.  If you are age 22 or younger, your body mass index should be between 19-25. Your Body mass index is 31.17 kg/m. If this is out of the aformentioned range listed, please consider follow up with your Primary Care Provider.   Please go to the lab at [facility] ([place] You will need to go to level "B", you do not need an appointment for this. Hours available are 7:30 am - 4:30 pm.   Please purchase the following medications over the counter and take as directed: Zinc 50 mg once daily.   Follow up in 6 months   Thank you,  Dr. Jackquline Denmark

## 2019-01-25 LAB — AFP TUMOR MARKER: AFP-Tumor Marker: 16.2 ng/mL — ABNORMAL HIGH (ref <6.1)

## 2019-01-28 ENCOUNTER — Other Ambulatory Visit: Payer: Self-pay

## 2019-01-28 DIAGNOSIS — K7460 Unspecified cirrhosis of liver: Secondary | ICD-10-CM

## 2019-01-30 ENCOUNTER — Other Ambulatory Visit: Payer: Self-pay

## 2019-01-30 ENCOUNTER — Ambulatory Visit (HOSPITAL_BASED_OUTPATIENT_CLINIC_OR_DEPARTMENT_OTHER)
Admission: RE | Admit: 2019-01-30 | Discharge: 2019-01-30 | Disposition: A | Discharge status: Home / Self Care | Payer: Medicare Other | Source: Ambulatory Visit | Attending: Gastroenterology | Admitting: Gastroenterology

## 2019-01-30 ENCOUNTER — Encounter (HOSPITAL_BASED_OUTPATIENT_CLINIC_OR_DEPARTMENT_OTHER): Payer: Self-pay | Admitting: Radiology

## 2019-01-30 DIAGNOSIS — K7460 Unspecified cirrhosis of liver: Secondary | ICD-10-CM

## 2019-01-30 MED ORDER — IOHEXOL 300 MG/ML  SOLN
100.0000 mL | Freq: Once | INTRAMUSCULAR | Status: AC | PRN
  Administered 2019-01-30: 100 mL via INTRAVENOUS

## 2019-02-01 ENCOUNTER — Other Ambulatory Visit: Payer: Self-pay

## 2019-02-01 DIAGNOSIS — K7460 Unspecified cirrhosis of liver: Secondary | ICD-10-CM

## 2019-02-04 ENCOUNTER — Telehealth: Payer: Self-pay | Admitting: Gastroenterology

## 2019-02-04 MED ORDER — SPIRONOLACTONE 50 MG PO TABS: 50.0000 mg | ORAL_TABLET | Freq: Every day | ORAL | 6 refills | Status: DC

## 2019-02-04 MED ORDER — FUROSEMIDE 40 MG PO TABS: 40.0000 mg | ORAL_TABLET | Freq: Every day | ORAL | 6 refills | Status: AC

## 2019-02-04 MED ORDER — RIFAXIMIN 550 MG PO TABS: 550.0000 mg | ORAL_TABLET | Freq: Two times a day (BID) | ORAL | 11 refills | Status: AC

## 2019-02-04 MED ORDER — LACTULOSE 10 GM/15ML PO SOLN: ORAL | 4 refills | Status: AC

## 2019-02-04 NOTE — Telephone Encounter (Signed)
I have spoke to patient and I have sent prescriptions to patients pharmacy.

## 2019-02-04 NOTE — Telephone Encounter (Signed)
Pt requested to update pharmacy to [facility] in [place].  He would like a call back to discuss medications.

## 2019-02-04 NOTE — Telephone Encounter (Signed)
I have returned patients call, was unable to leave message will return call later.

## 2019-03-04 ENCOUNTER — Telehealth: Payer: Self-pay | Admitting: Gastroenterology

## 2019-03-04 NOTE — Telephone Encounter (Signed)
Called and spoke with patient-patient informed that on his last OV notation -Zinc was the recommended medication- patient verbalized understanding and remembers Dr. Lyndel Safe mentioning that medication; Patient verbalized understanding of information/instructions;   Patient advised to call back to the office at [phone] should questions/concerns arise;

## 2019-04-26 ENCOUNTER — Other Ambulatory Visit: Payer: Self-pay

## 2019-04-26 MED ORDER — SPIRONOLACTONE 50 MG PO TABS: 50.0000 mg | ORAL_TABLET | Freq: Every day | ORAL | 3 refills | Status: AC

## 2019-05-06 ENCOUNTER — Telehealth: Payer: Self-pay

## 2019-05-06 DIAGNOSIS — K7460 Unspecified cirrhosis of liver: Secondary | ICD-10-CM

## 2019-05-06 NOTE — Telephone Encounter (Signed)
Called and spoke with patient-patient informed of MD recommendations; patient is agreeable with plan of care; Patient verbalized understanding of information/instructions and will complete lab work as soon as possible; Patient was advised to call the office at [phone] if questions/concerns arise;

## 2019-05-06 NOTE — Telephone Encounter (Signed)
-----   Message from Junius Roads, [place] sent at 05/06/2019  9:20 AM EST ----- Regarding: FW: repeat lab work Looks like you were working on this.  ----- Message ----- From: Johnney Killian, RN Sent: 05/04/2019 To: Junius Roads, CMA Subject: repeat lab work                                Please notify the patient he is needing to complete repeat lab work as requested by Dr. Chales Abrahams; order for AFP is already in Epic-patient just needs a letter sent for reminder to complete this (3 months from 02/01/2019) Thank you Bre

## 2019-05-07 ENCOUNTER — Other Ambulatory Visit: Payer: Medicare Other

## 2019-05-07 DIAGNOSIS — K7460 Unspecified cirrhosis of liver: Secondary | ICD-10-CM

## 2019-05-08 LAB — AFP TUMOR MARKER: AFP-Tumor Marker: 8.5 ng/mL — ABNORMAL HIGH (ref <6.1)

## 2019-05-10 NOTE — Telephone Encounter (Signed)
AFP reviewed Has gone down from 16.2 (3 months ago) to 8.5 Very good news Recommend to repeat again in 3 months   RG

## 2019-05-10 NOTE — Telephone Encounter (Signed)
Called and spoke with patient-patient advised that lab work has been resulted, however, the MD has not reviewed these results;  Please review and advise

## 2019-05-10 NOTE — Telephone Encounter (Signed)
Pt inquired about results of labs.

## 2019-05-13 NOTE — Telephone Encounter (Signed)
Called and spoke with patient-patient informed of result note and MD recommendations; patient is agreeable with plan of care; Patient verbalized understanding of information/instructions;  Patient was advised to call the office at [phone] if questions/concerns arise; order for repeat AFP has been placed in Epic; staff msg has been sent to have a reminder sent to the patient for the repeat lab work;

## 2019-05-13 NOTE — Addendum Note (Signed)
Addended by: Johnney Killian on: 05/13/2019 09:49 AM   Modules accepted: Orders

## 2019-08-07 IMAGING — US US ABDOMEN COMPLETE W/ ELASTOGRAPHY
1 series · 12 of 12 positions shown · non-contrast
Comparison: None.

CLINICAL DATA: Cirrhosis



[Series 1: us abdomen complete w/ elastography · 12 of 12 slices shown]
[im 1/12]
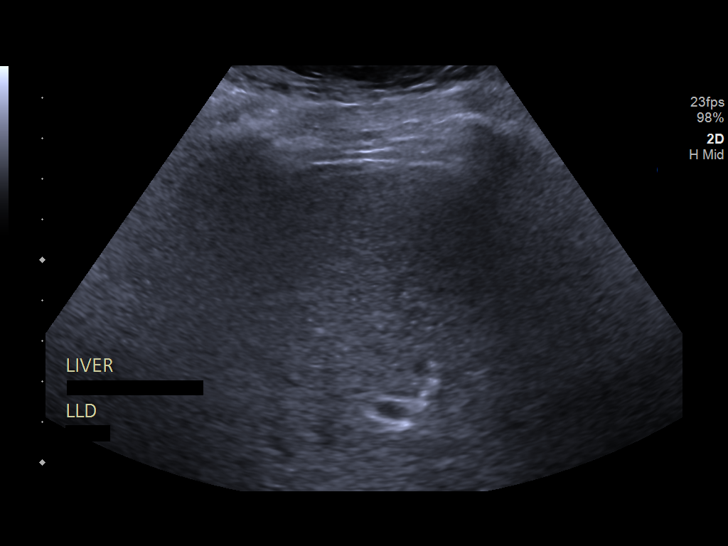
[im 2/12]
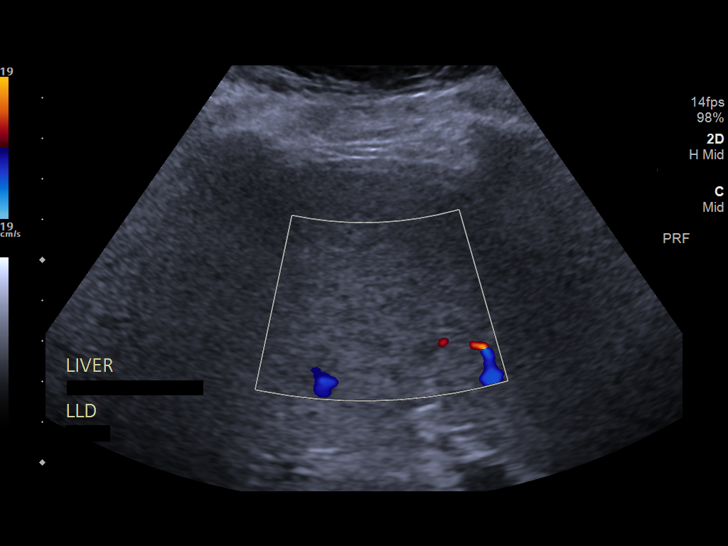
[im 3/12]
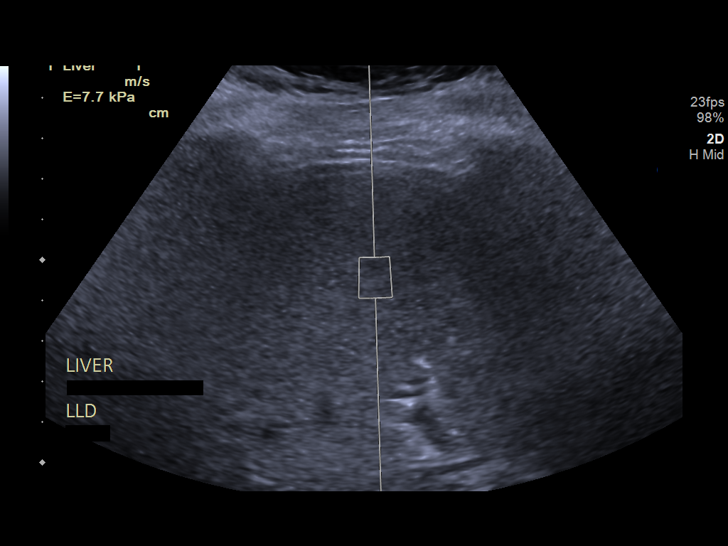
[im 4/12]
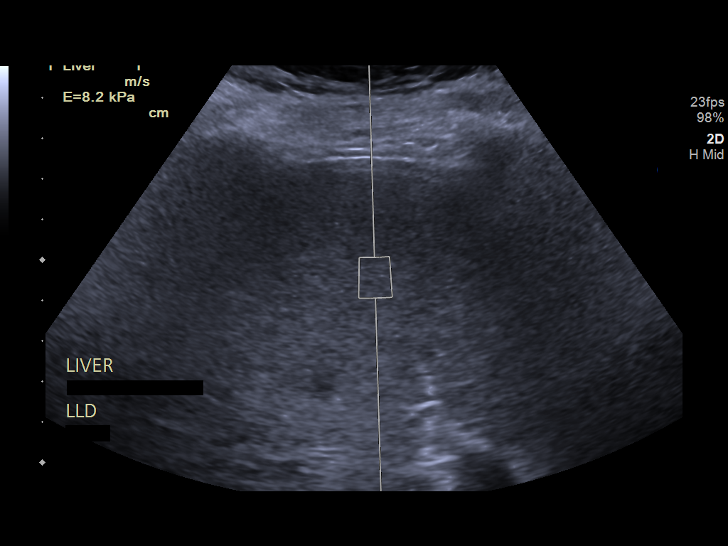
[im 5/12]
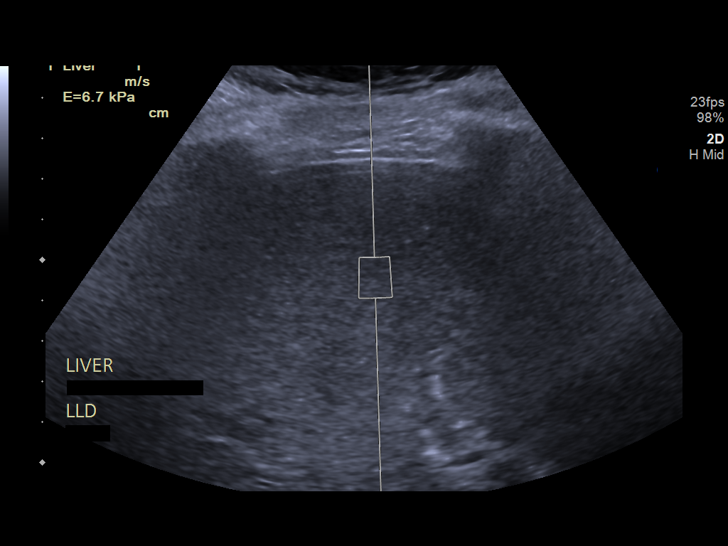
[im 6/12]
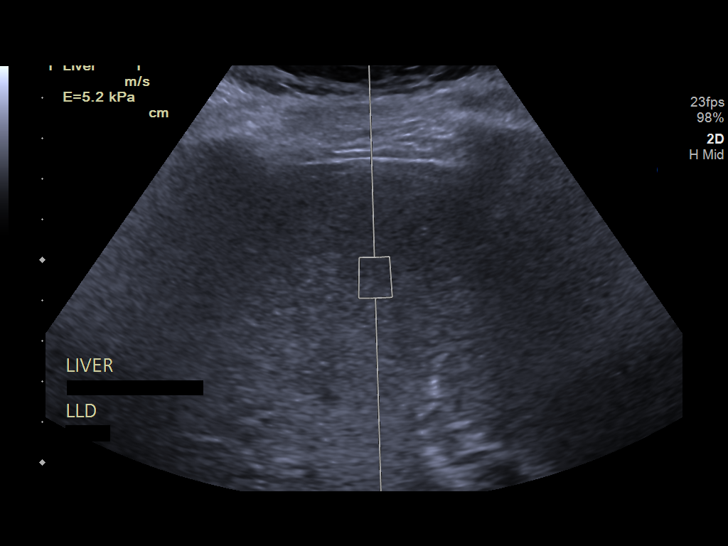
[im 7/12]
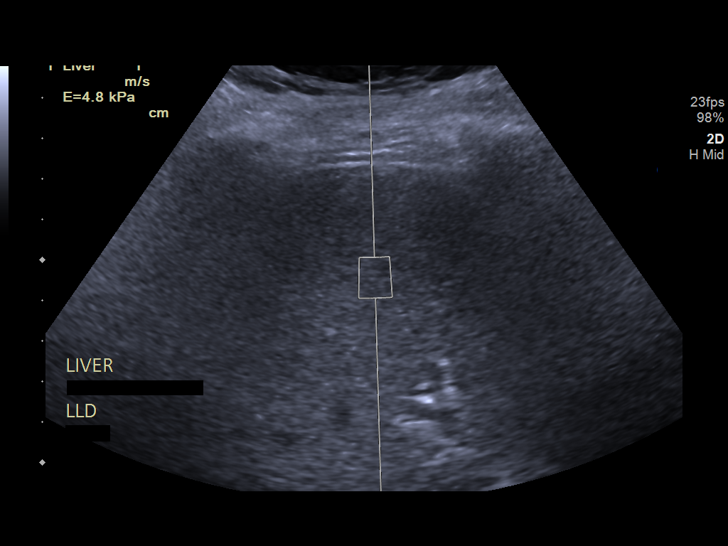
[im 8/12]
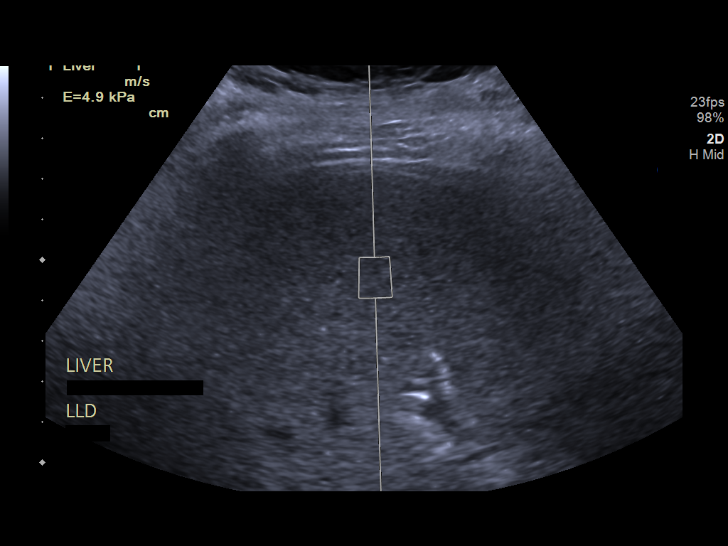
[im 9/12]
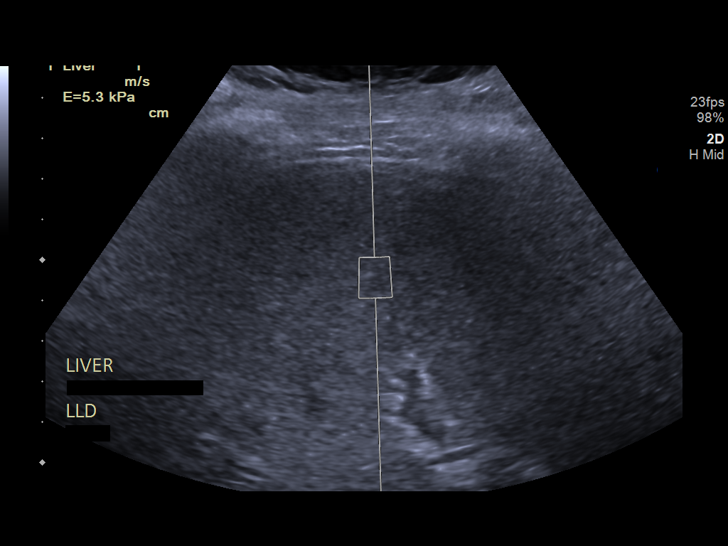
[im 10/12]
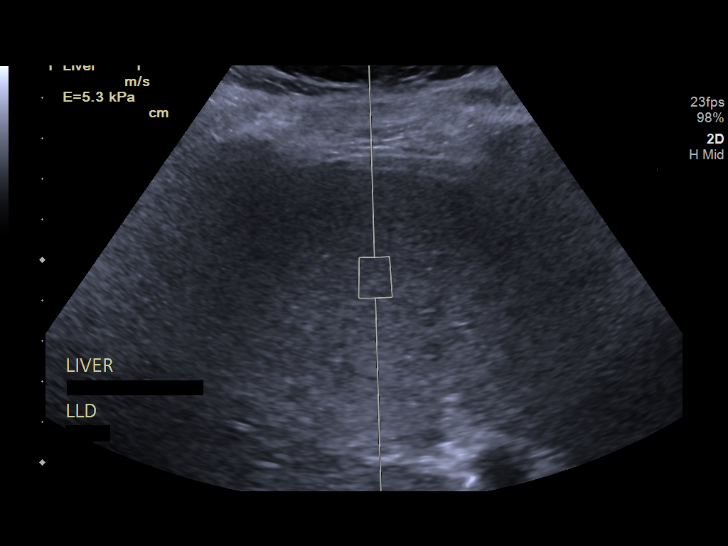
[im 11/12]
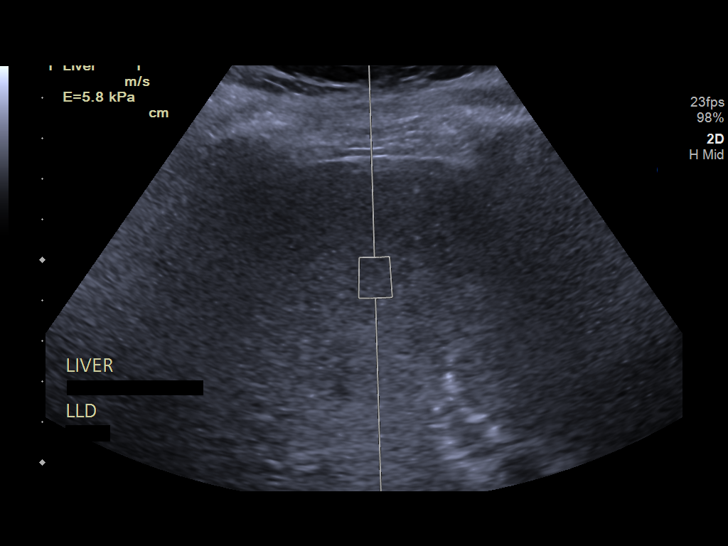
[im 12/12]
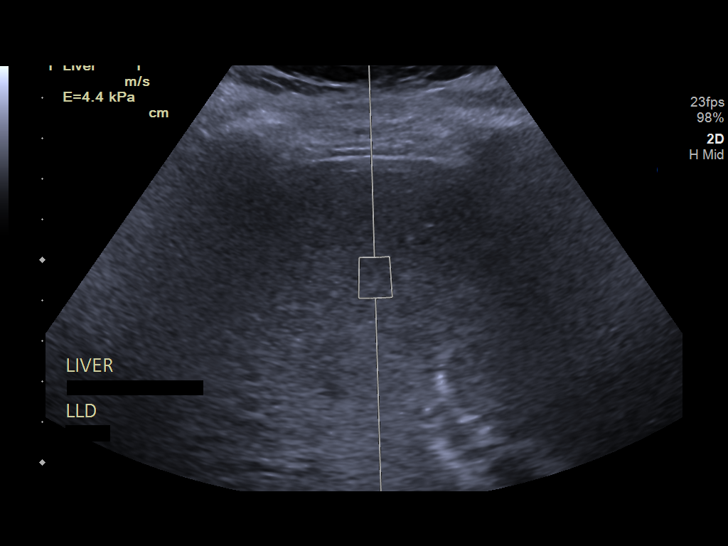

[12 of 12 positions shown; findings below may reference images not displayed]

FINDINGS: ULTRASOUND ABDOMEN

Gallbladder: No gallstones or wall thickening visualized. No
sonographic Murphy sign noted by sonographer.

Common bile duct: Diameter: Normal caliber, 3 mm

Liver: Coarsened echotexture and nodular contours compatible with
cirrhosis. No focal hepatic abnormality or biliary ductal
dilatation. Portal vein is patent on color Doppler imaging with
normal direction of blood flow towards the liver.

IVC: No abnormality visualized.

Pancreas: Visualized portion unremarkable.

Spleen: Size and appearance within normal limits.

Right Kidney: Length: 13.0 cm. Echogenicity within normal limits. No
mass or hydronephrosis visualized.

Left Kidney: Length: 12.8 cm. Echogenicity within normal limits. No
mass or hydronephrosis visualized.

Abdominal aorta: No aneurysm visualized.

Other findings: None.

ULTRASOUND HEPATIC ELASTOGRAPHY

Device: Siemens Helix VTQ

Patient position: Left lateral decubitus

Transducer [id number]

Number of measurements: 10

Hepatic segment:  8

Median velocity:   1.39 m/sec

IQR:

IQR/Median velocity ratio:

Corresponding Metavir fibrosis score:  F2 + some F3

Risk of fibrosis: Moderate

Limitations of exam: None

Please note that abnormal shear wave velocities may also be
identified in clinical settings other than with hepatic fibrosis,
such as: acute hepatitis, elevated right heart and central venous
pressures including use of beta blockers, Fumu disease
(Przyborowski), infiltrative processes such as
mastocytosis/amyloidosis/infiltrative tumor, extrahepatic
cholestasis, in the post-prandial state, and liver transplantation.
Correlation with patient history, laboratory data, and clinical
condition recommended.
IMPRESSION: ULTRASOUND ABDOMEN: Cirrhosis.  No focal hepatic abnormality.

ULTRASOUND HEPATIC ELASTOGRAPHY:

Median hepatic shear wave velocity is calculated at 1.39 m/sec.

Corresponding Metavir fibrosis score is F2 + some F3.

Risk of fibrosis is Moderate.

Follow-up: Additional testing appropriate

## 2019-08-16 ENCOUNTER — Other Ambulatory Visit: Payer: Medicare Other

## 2019-08-16 DIAGNOSIS — K7460 Unspecified cirrhosis of liver: Secondary | ICD-10-CM

## 2019-08-19 LAB — AFP TUMOR MARKER: AFP-Tumor Marker: 8.2 ng/mL — ABNORMAL HIGH (ref <6.1)

## 2019-09-03 DEATH — deceased

## 2020-05-21 IMAGING — CT CT ABD-PELV W/ CM
2 of 5 series · 16 of 46 positions shown, 18 images · IV contrast (Omnipaque)
Comparison: Abdominal sonogram 04/17/2018

CLINICAL DATA: History of liver disease. Cirrhosis.

EXAM:
CT ABDOMEN WITHOUT AND WITH CONTRAST
TECHNIQUE: Multidetector CT imaging of the abdomen was performed following the
standard protocol before with imaging of the abdomen and pelvis
performed following the bolus administration of intravenous
contrast.
Patient was initially imaged on 01/30/2019. Additional arterial
phase images were obtained to complete the exam on 01/31/2019. All
images are reviewed on today's report for assessment.
CONTRAST:  100mL OMNIPAQUE IOHEXOL 300 MG/ML  SOLN

[Series 2: axial st · axial · 0.95mm/px · z∈[-558,-63]mm · 13 of 111 slices shown, 15 images]
[im 6/111  soft-tissue]
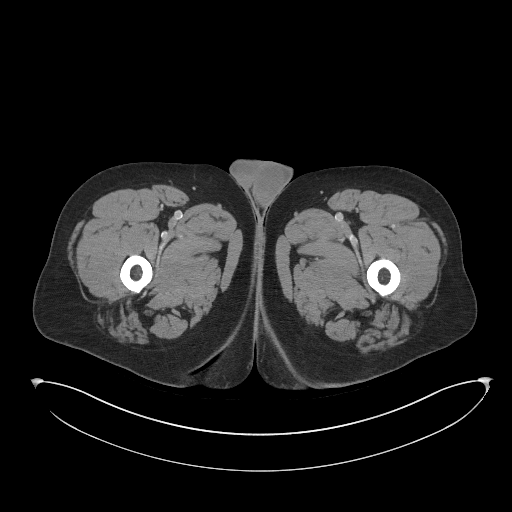
[im 6/111  bone]
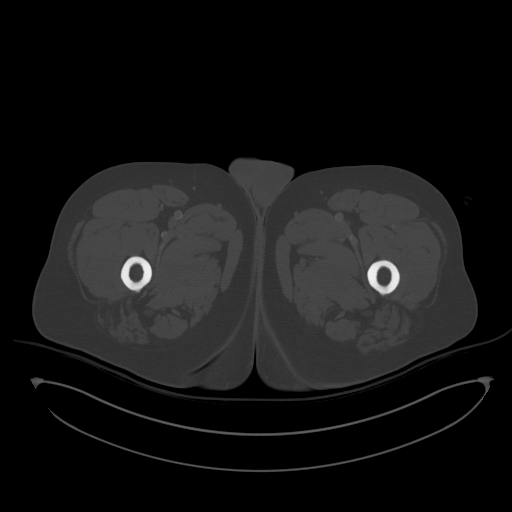
[im 18/111  soft-tissue]
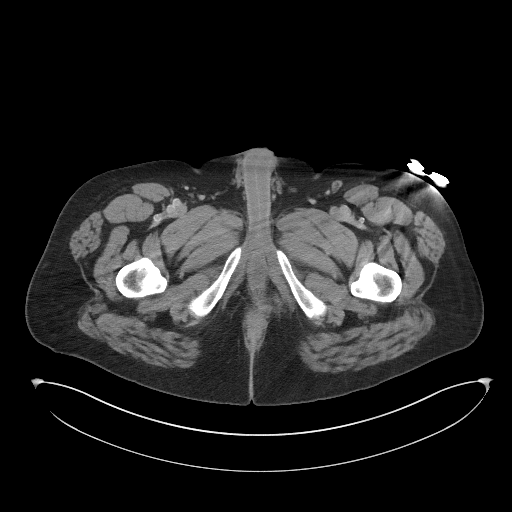
[im 24/111  soft-tissue]
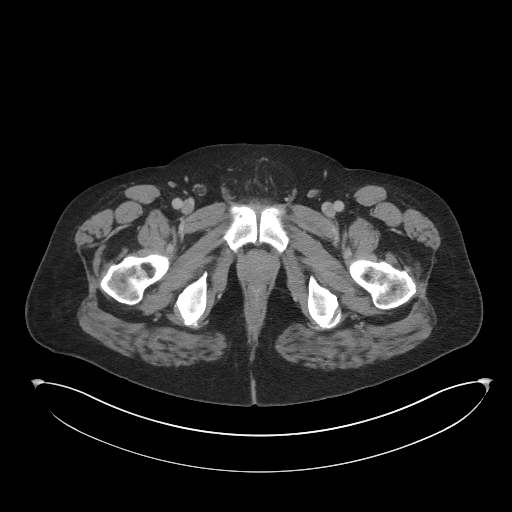
[im 29/111  soft-tissue]
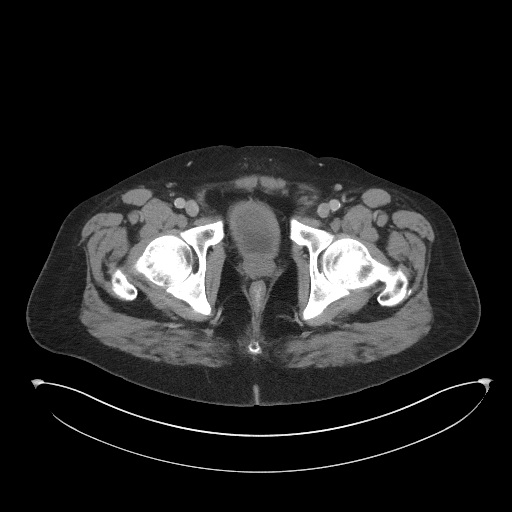
[im 41/111  soft-tissue]
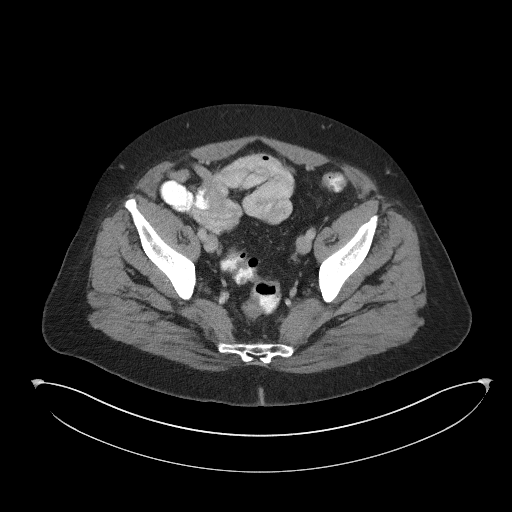
[im 47/111  soft-tissue]
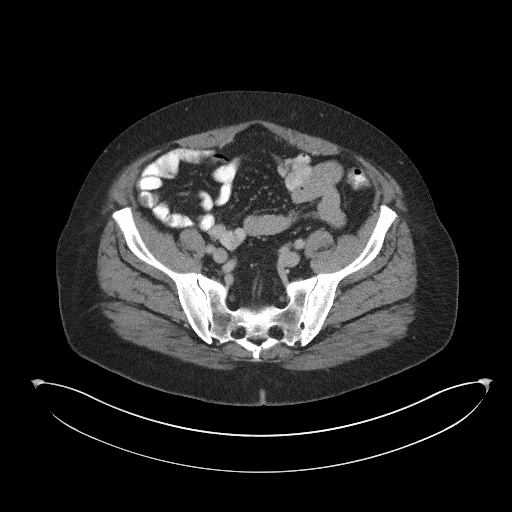
[im 58/111  soft-tissue]
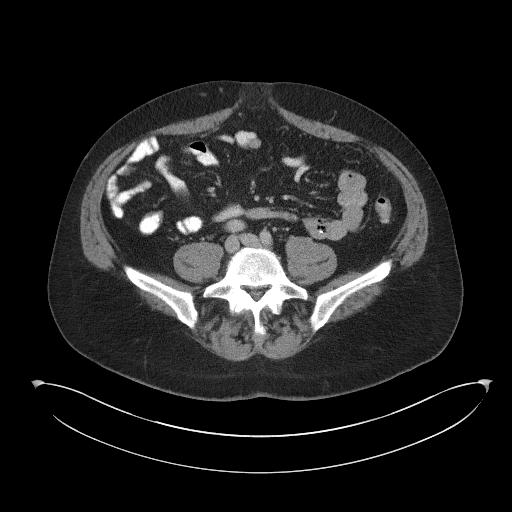
[im 64/111  soft-tissue]
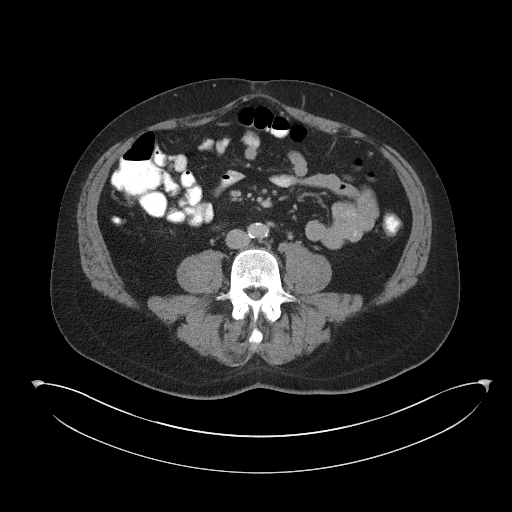
[im 70/111  soft-tissue]
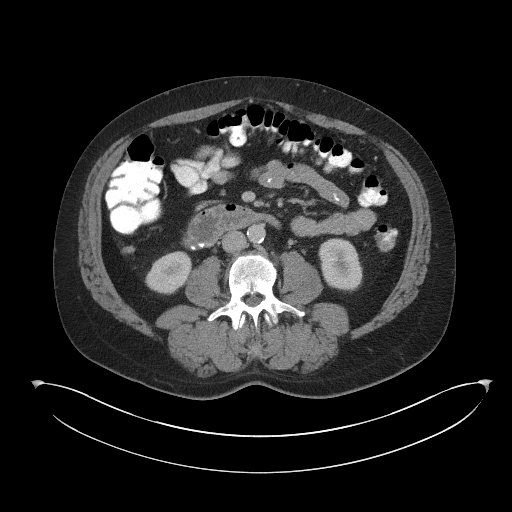
[im 70/111  bone]
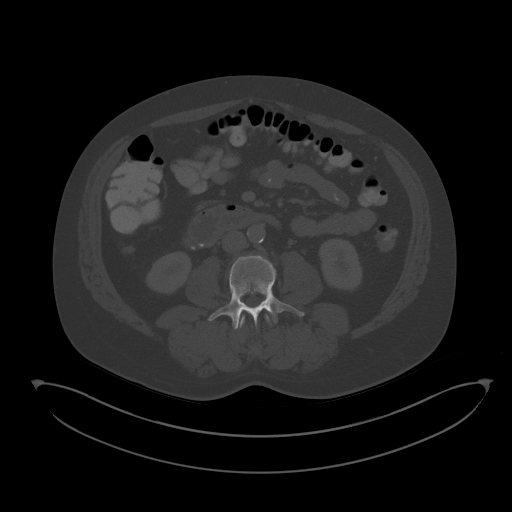
[im 82/111  soft-tissue]
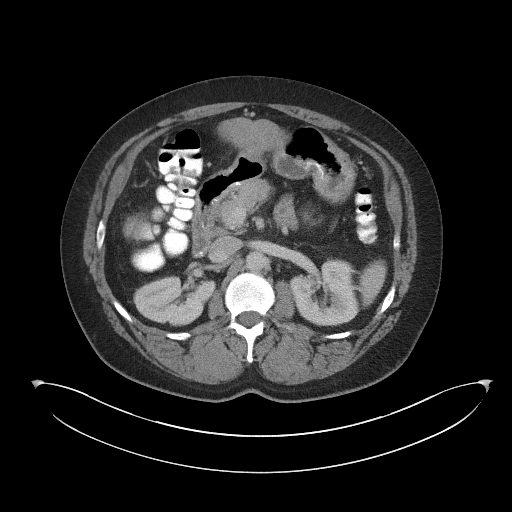
[im 87/111  soft-tissue]
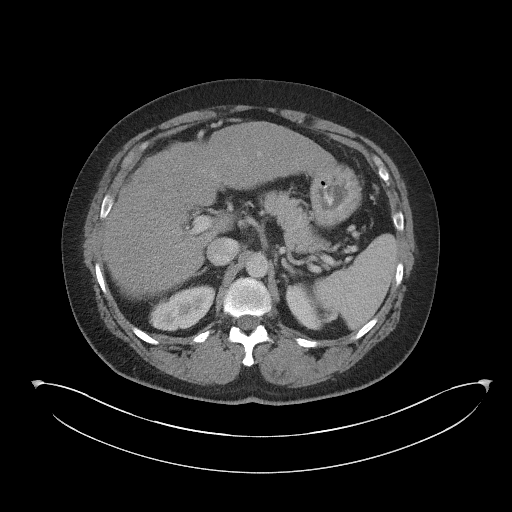
[im 93/111  soft-tissue]
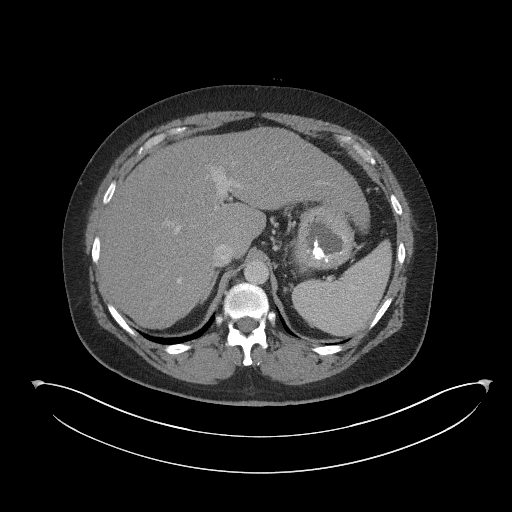
[im 105/111  soft-tissue]
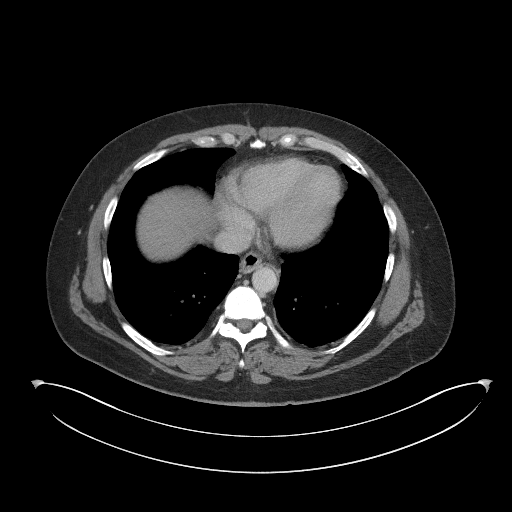

[Series 5: coronal st · coronal · 0.88mm/px · 3 of 107 slices shown]
[im 36/107  soft-tissue]
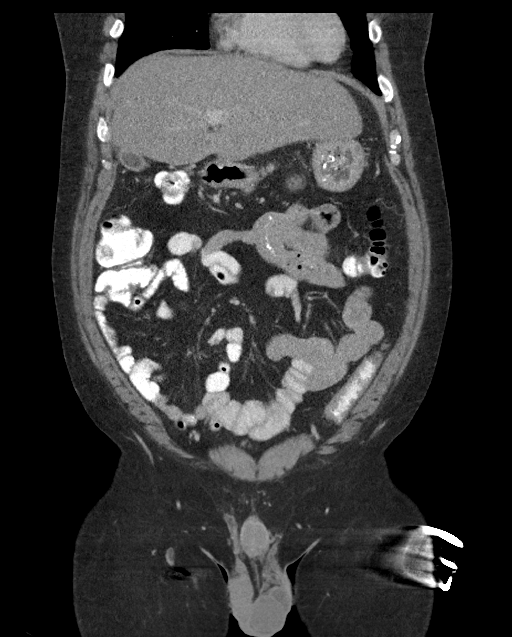
[im 48/107  soft-tissue]
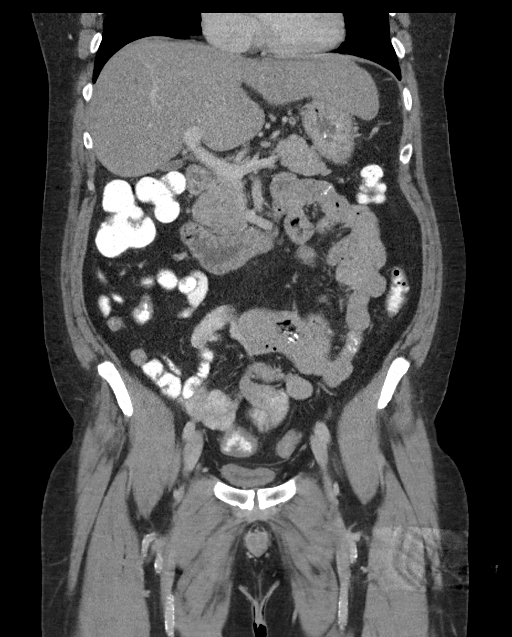
[im 59/107  soft-tissue]
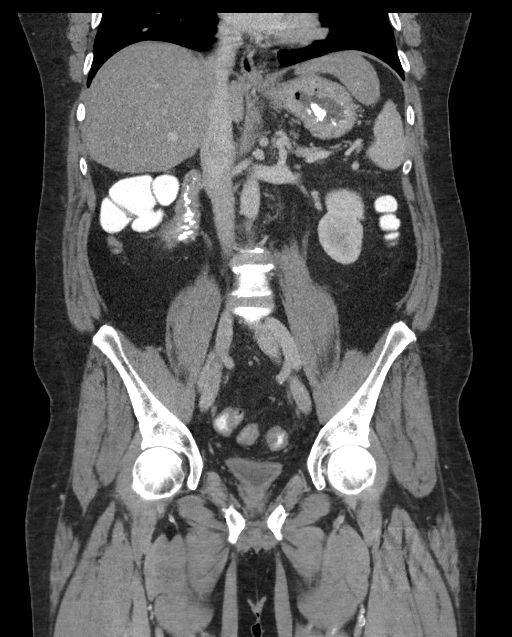

[16 of 46 positions shown; findings below may reference images not displayed]

FINDINGS: Lower chest: Lung bases are clear without signs of consolidation or
effusion.

Hepatobiliary: Liver displays a lobulated contour. Hepatic arterial
supply is classic arising from celiac axis. The portal vein is
patent as is the splenic vein and SMV.

No focal suspicious hepatic lesion. Biliary tree is normal.

Pancreas: Unremarkable. No pancreatic ductal dilatation or
surrounding inflammatory changes.

Spleen: Normal in size without focal abnormality.

Adrenals/Urinary Tract: Adrenal glands are unremarkable. Kidneys are
normal, without renal calculi, focal lesion, or hydronephrosis.

Stomach/Bowel: Normal appearance of gastrointestinal tract.

Vascular/Lymphatic: Scattered atherosclerosis. No signs of aneurysm.

No signs of adenopathy in the upper abdomen.

No signs of adenopathy in the pelvis.

Left retroperitoneal collaterals with perigastric varices.
Assessment for gastric varices or esophageal varices limited due to
the presence of positive enteric contrast.

Prostate unremarkable. Urinary bladder under distended.

Other: Moderate-size umbilical hernia contains only fat. No signs of
ascites. No signs of free air.

Musculoskeletal: No acute bone finding or destructive bone process.
Spinal degenerative change.
IMPRESSION: 1. No signs of hepatocellular carcinoma with signs of cirrhosis and
portal hypertension.
2. Portosystemic collaterals in the left upper quadrant.
3. No ascites.
4. Moderate-size umbilical hernia contains only fat.

Aortic Atherosclerosis ([id number].S).
